# Patient Record
Sex: Male | Born: 1980 | Race: Black or African American | Hispanic: No | Marital: Married | Smoking: Current every day smoker
Health system: Southern US, Community
[De-identification: ages and names within clinical notes are randomized; demographics above are authoritative.]

## PROBLEM LIST (undated history)

## (undated) HISTORY — PX: CHOLECYSTECTOMY: SHX55

---

## 2001-11-04 ENCOUNTER — Emergency Department (HOSPITAL_COMMUNITY): Admission: EM | Admit: 2001-11-04 | Discharge: 2001-11-04 | Payer: Self-pay | Admitting: Emergency Medicine

## 2001-11-04 ENCOUNTER — Encounter: Payer: Self-pay | Admitting: Emergency Medicine

## 2002-07-13 ENCOUNTER — Encounter: Payer: Self-pay | Admitting: *Deleted

## 2002-07-13 ENCOUNTER — Emergency Department (HOSPITAL_COMMUNITY): Admission: EM | Admit: 2002-07-13 | Discharge: 2002-07-13 | Payer: Self-pay | Admitting: *Deleted

## 2002-08-01 ENCOUNTER — Emergency Department (HOSPITAL_COMMUNITY): Admission: EM | Admit: 2002-08-01 | Discharge: 2002-08-01 | Payer: Self-pay | Admitting: Emergency Medicine

## 2002-08-01 ENCOUNTER — Encounter: Payer: Self-pay | Admitting: Emergency Medicine

## 2008-10-09 ENCOUNTER — Inpatient Hospital Stay (HOSPITAL_COMMUNITY): Admission: EM | Admit: 2008-10-09 | Discharge: 2008-10-14 | Payer: Self-pay | Admitting: Emergency Medicine

## 2008-10-09 ENCOUNTER — Ambulatory Visit: Payer: Self-pay | Admitting: Internal Medicine

## 2008-10-20 ENCOUNTER — Inpatient Hospital Stay (HOSPITAL_COMMUNITY): Admission: EM | Admit: 2008-10-20 | Discharge: 2008-11-03 | Payer: Self-pay | Admitting: Emergency Medicine

## 2008-10-20 ENCOUNTER — Ambulatory Visit: Payer: Self-pay | Admitting: Internal Medicine

## 2008-10-22 ENCOUNTER — Ambulatory Visit: Payer: Self-pay | Admitting: Gastroenterology

## 2008-11-14 ENCOUNTER — Ambulatory Visit: Payer: Self-pay | Admitting: Internal Medicine

## 2008-11-14 ENCOUNTER — Ambulatory Visit (HOSPITAL_COMMUNITY): Admission: RE | Admit: 2008-11-14 | Discharge: 2008-11-14 | Payer: Self-pay | Admitting: Internal Medicine

## 2008-11-14 ENCOUNTER — Encounter (INDEPENDENT_AMBULATORY_CARE_PROVIDER_SITE_OTHER): Payer: Self-pay | Admitting: *Deleted

## 2008-11-14 DIAGNOSIS — K869 Disease of pancreas, unspecified: Secondary | ICD-10-CM | POA: Insufficient documentation

## 2008-11-14 DIAGNOSIS — R000 Tachycardia, unspecified: Secondary | ICD-10-CM

## 2008-11-17 ENCOUNTER — Emergency Department (HOSPITAL_COMMUNITY): Admission: EM | Admit: 2008-11-17 | Discharge: 2008-11-17 | Payer: Self-pay | Admitting: Emergency Medicine

## 2008-11-20 DIAGNOSIS — D709 Neutropenia, unspecified: Secondary | ICD-10-CM

## 2008-11-24 ENCOUNTER — Inpatient Hospital Stay (HOSPITAL_COMMUNITY): Admission: EM | Admit: 2008-11-24 | Discharge: 2008-12-04 | Payer: Self-pay | Admitting: Emergency Medicine

## 2008-11-24 ENCOUNTER — Ambulatory Visit: Payer: Self-pay | Admitting: Pulmonary Disease

## 2008-11-30 ENCOUNTER — Ambulatory Visit: Payer: Self-pay | Admitting: Internal Medicine

## 2008-12-13 ENCOUNTER — Inpatient Hospital Stay (HOSPITAL_COMMUNITY): Admission: EM | Admit: 2008-12-13 | Discharge: 2008-12-21 | Payer: Self-pay | Admitting: *Deleted

## 2008-12-16 ENCOUNTER — Encounter (INDEPENDENT_AMBULATORY_CARE_PROVIDER_SITE_OTHER): Payer: Self-pay | Admitting: General Surgery

## 2009-05-01 IMAGING — CR DG ABDOMEN ACUTE W/ 1V CHEST
4 series · 4 of 4 positions shown · non-contrast
Comparison: CT 10/30/2008

CLINICAL DATA: Abdominal pain.  Vomiting blood.  History
pancreatitis.

ACUTE ABDOMEN SERIES (ABDOMEN 2 VIEW & CHEST 1 VIEW)

[w chest pa]
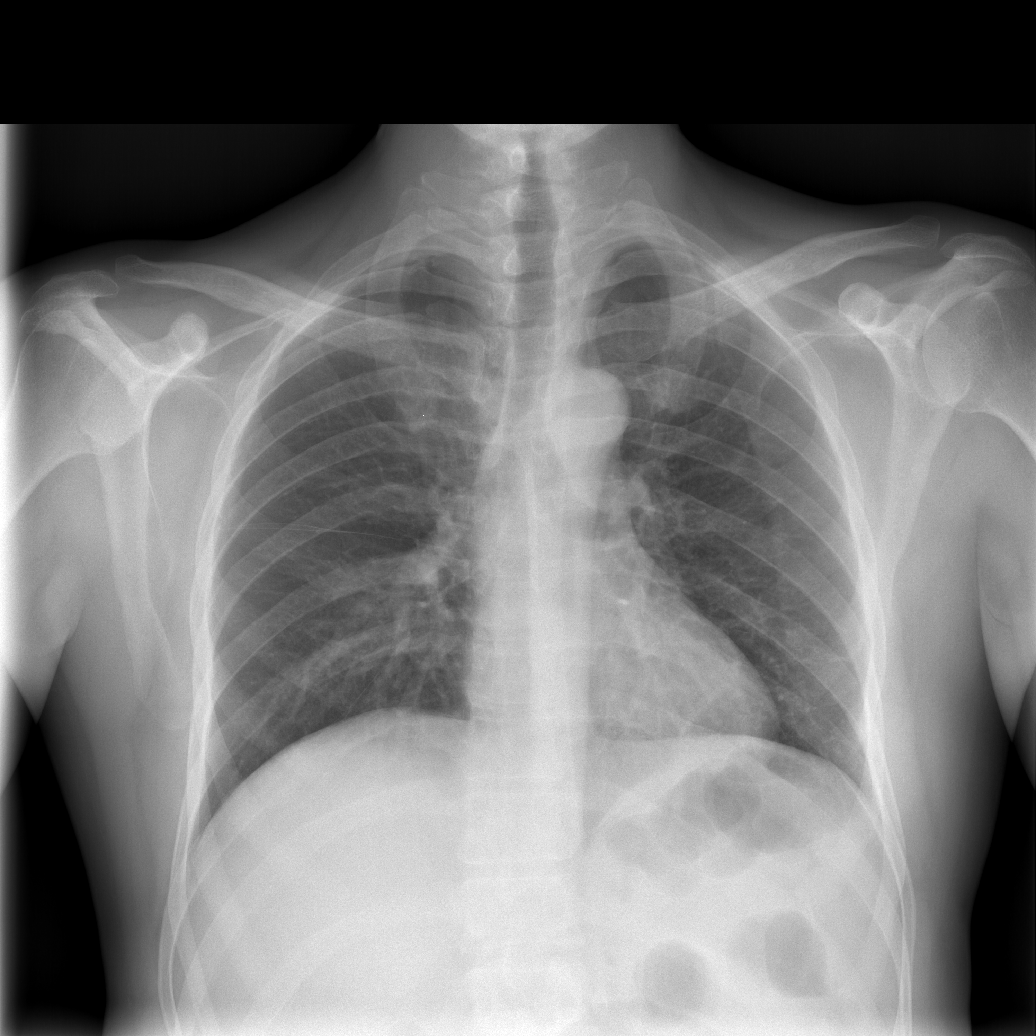

[w abdomen upright *]
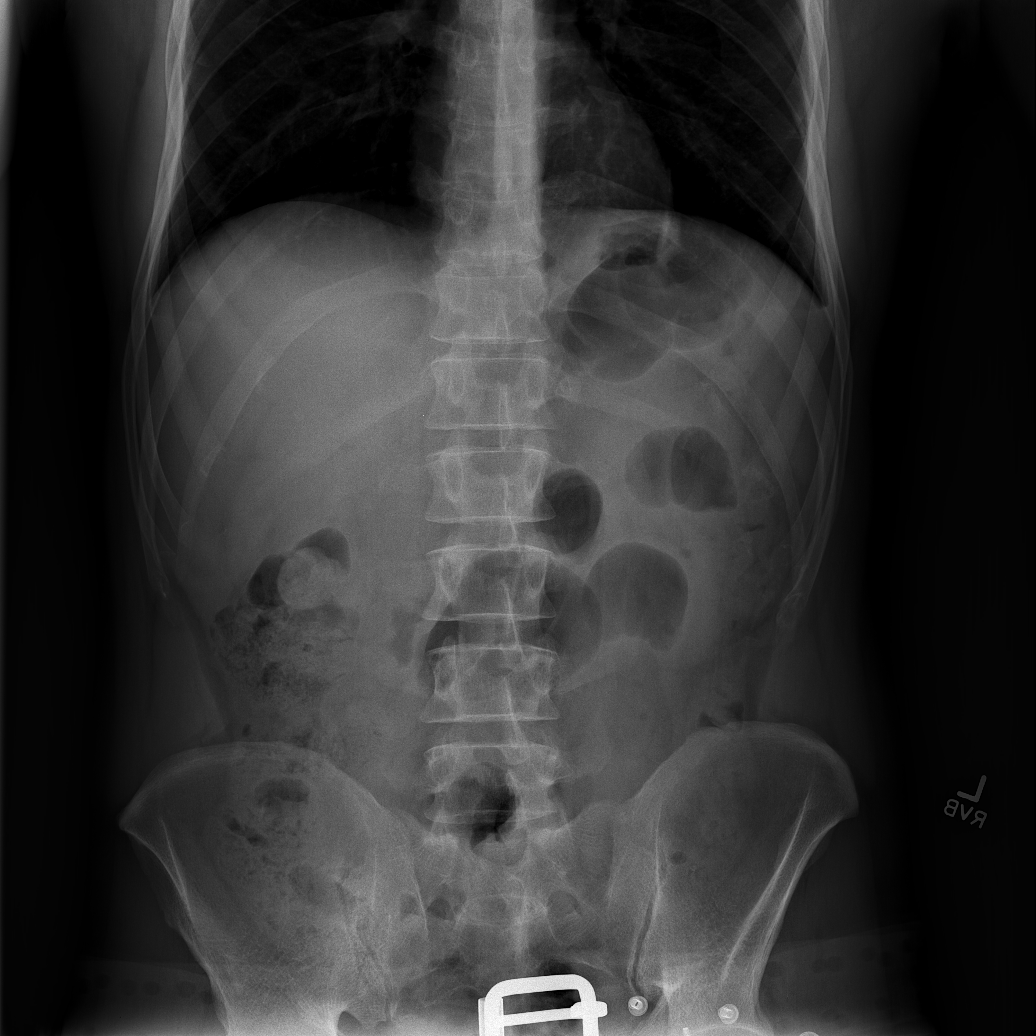

[t abdomen supine (1 of 2)]
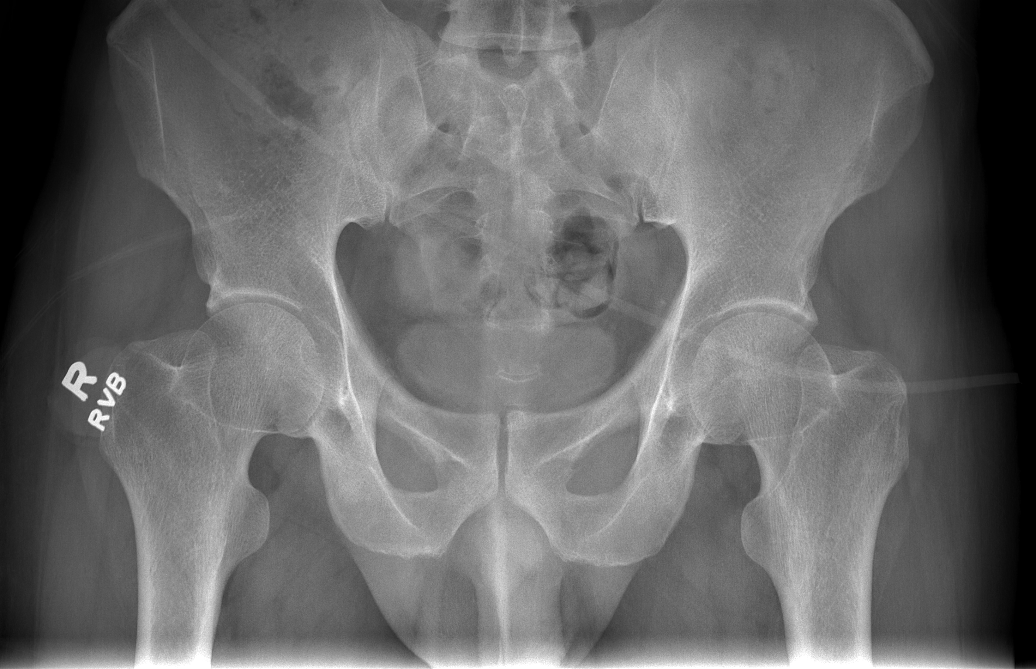

[t abdomen supine (2 of 2)]
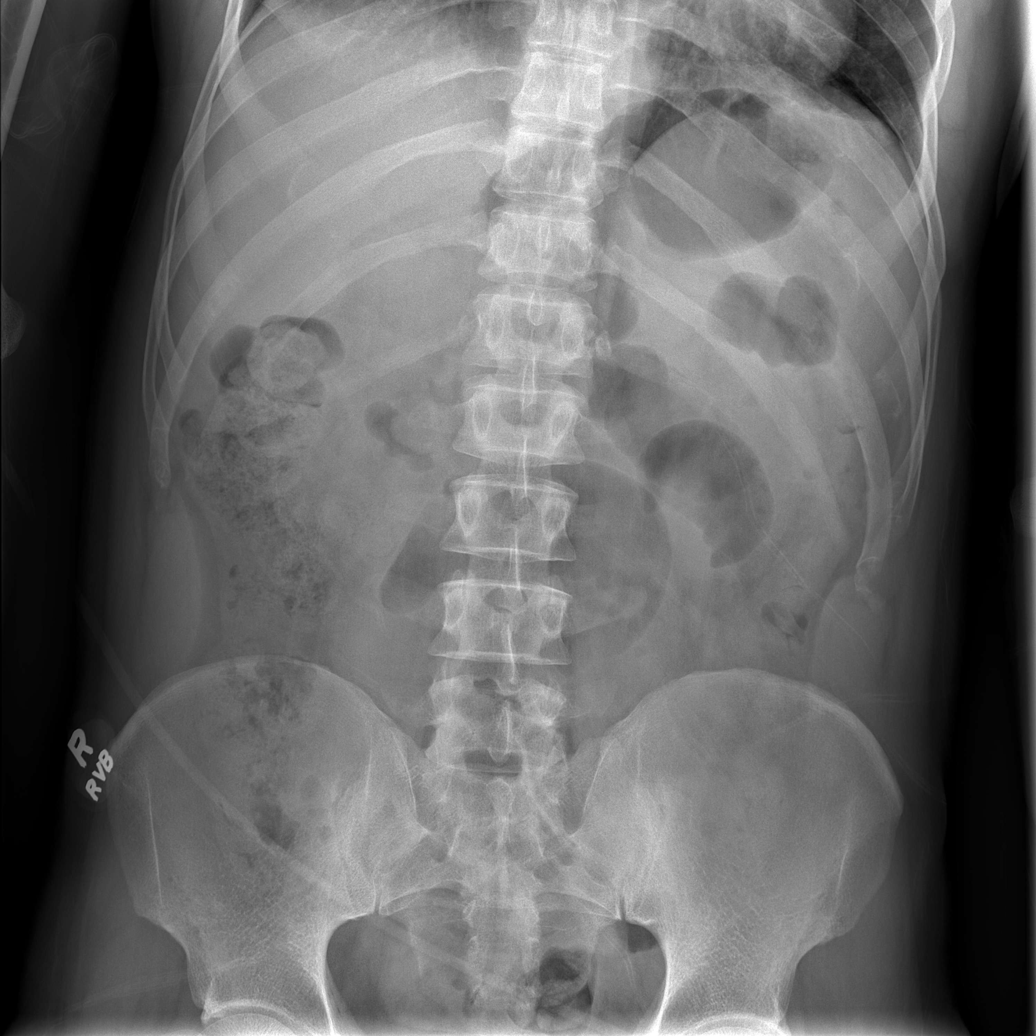

[4 of 4 positions shown; findings below may reference images not displayed]

FINDINGS: No active chest disease.  Gas distended loops of small
bowel in the mid to upper abdomen.  Findings are compatible with a
nonspecific ileus.  No free air.  Properitoneal fat stripes are
defined.
IMPRESSION: Nonspecific small bowel ileus.

## 2009-05-01 IMAGING — US US ABDOMEN PORT
1 series · 14 of 25 positions shown · non-contrast
Comparison: CT scan of the abdomen dated 11/24/2008

CLINICAL DATA: Acute pancreatitis.

ABDOMEN ULTRASOUND
TECHNIQUE: Complete abdominal ultrasound examination was performed
including evaluation of the liver, gallbladder, bile ducts,
pancreas, kidneys, spleen, IVC, and abdominal aorta.

[Series 1: unknown · 0.28mm/px · 14 of 79 slices shown]
[im 1/79]
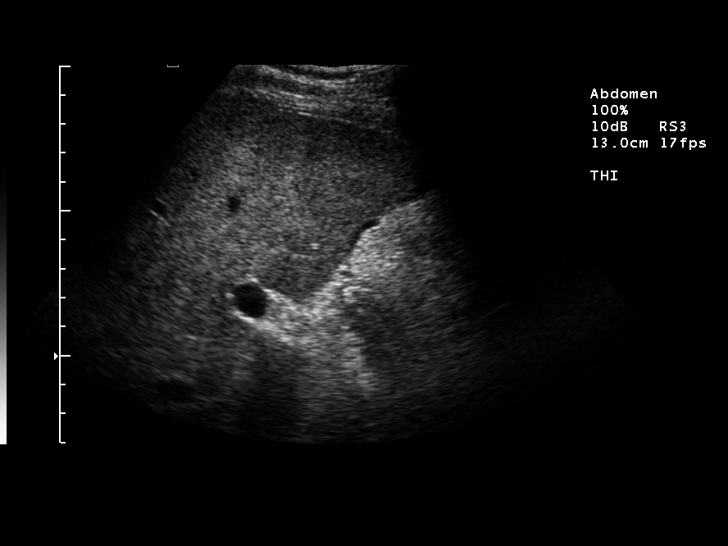
[im 7/79]
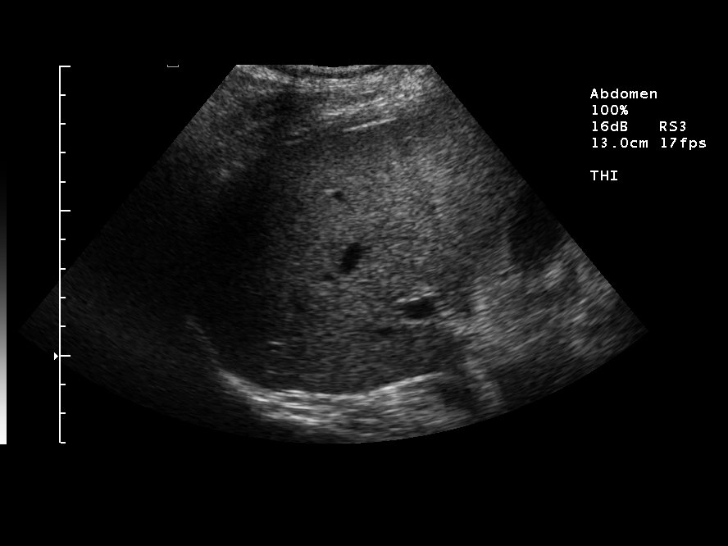
[im 14/79]
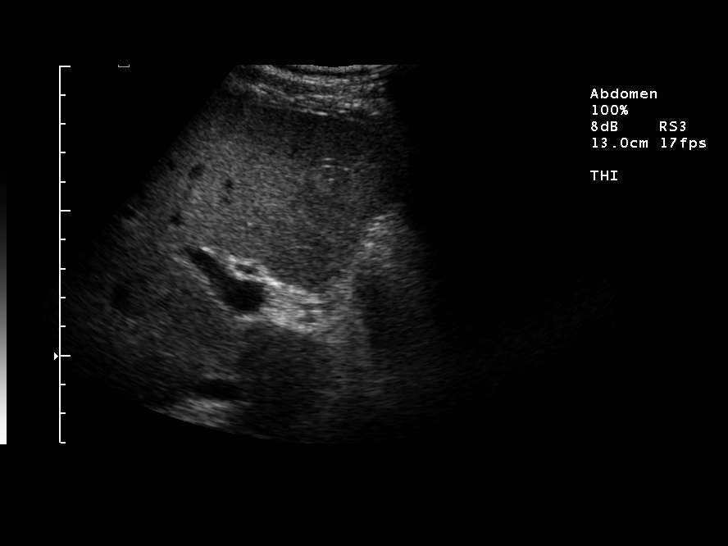
[im 20/79]
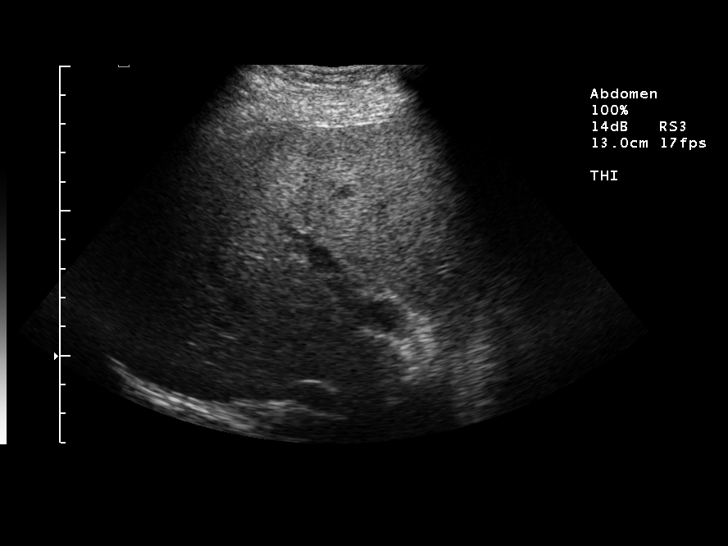
[im 27/79]
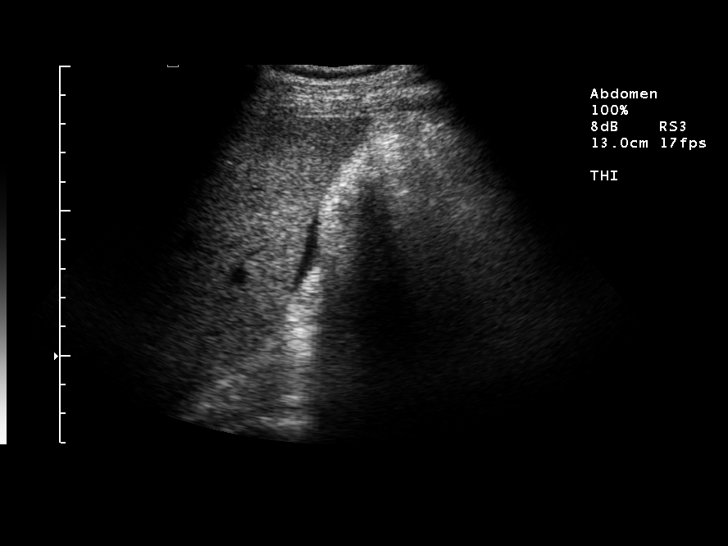
[im 30/79]
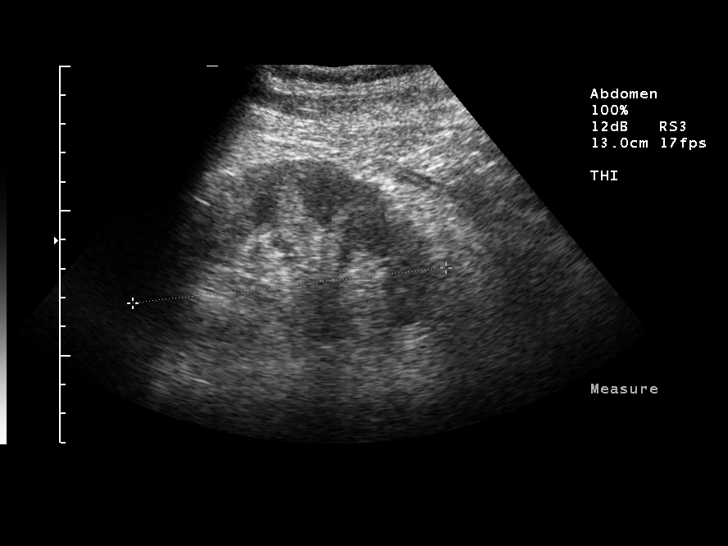
[im 36/79]
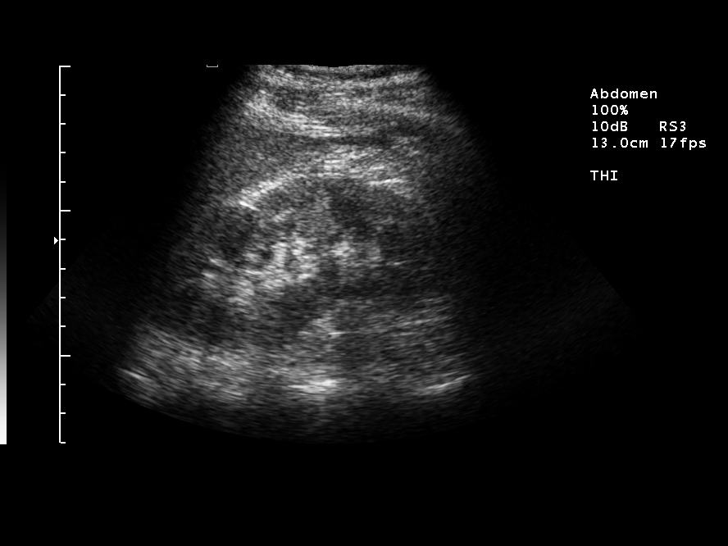
[im 43/79]
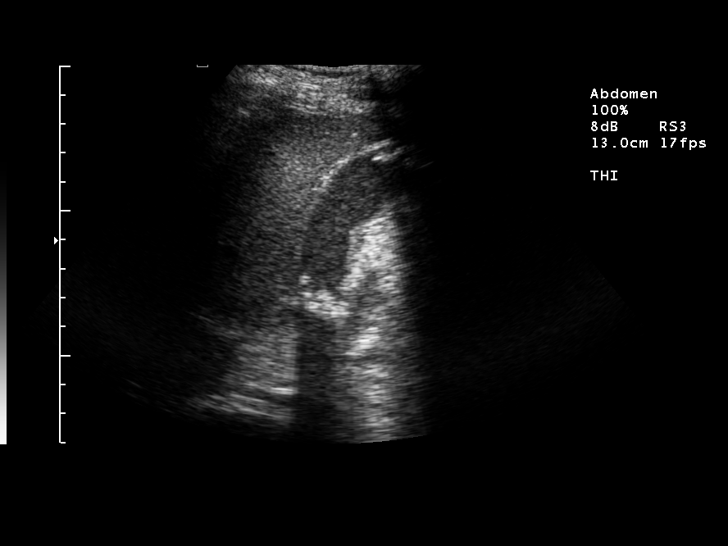
[im 49/79]
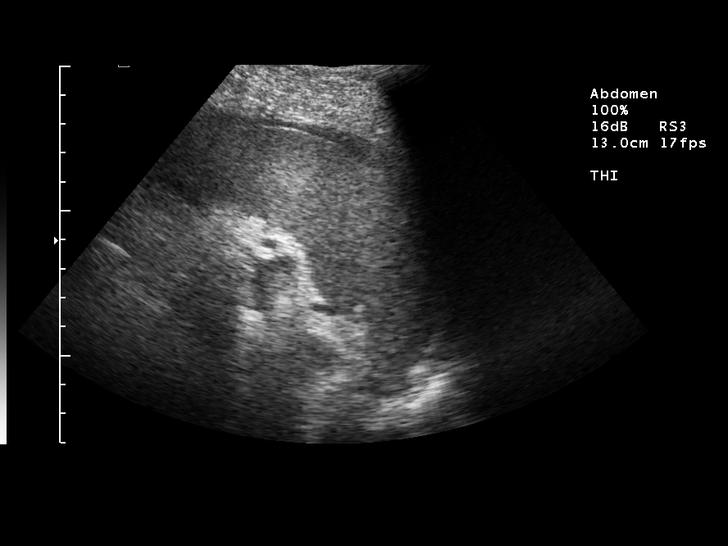
[im 53/79]
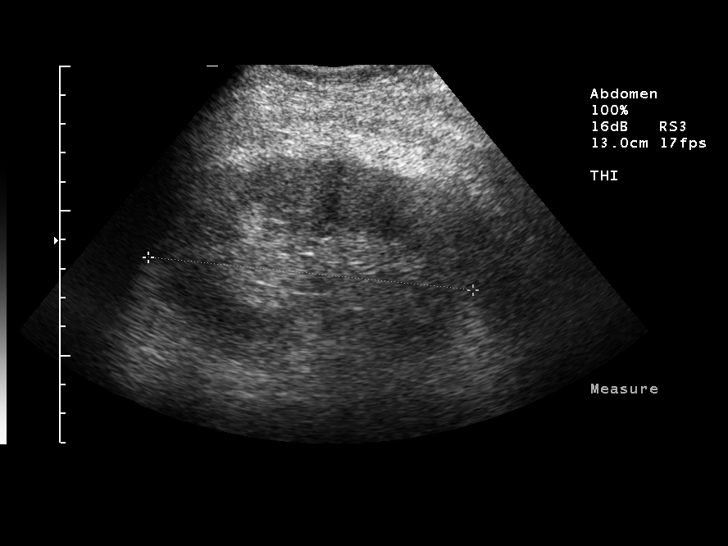
[im 59/79]
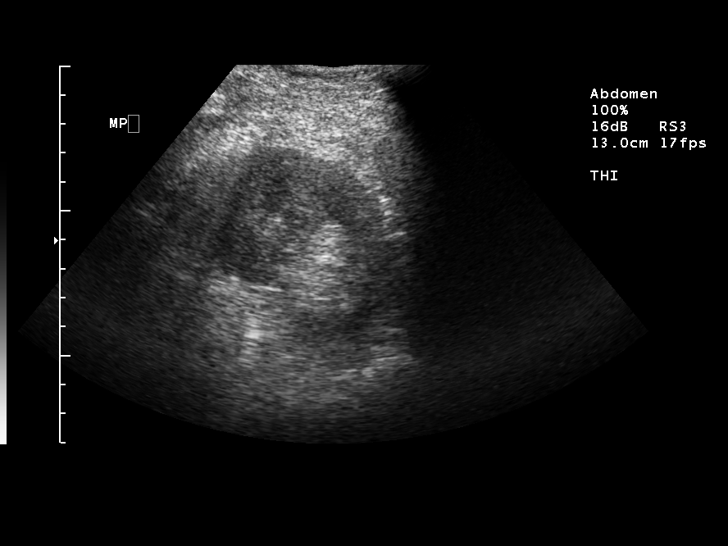
[im 66/79]
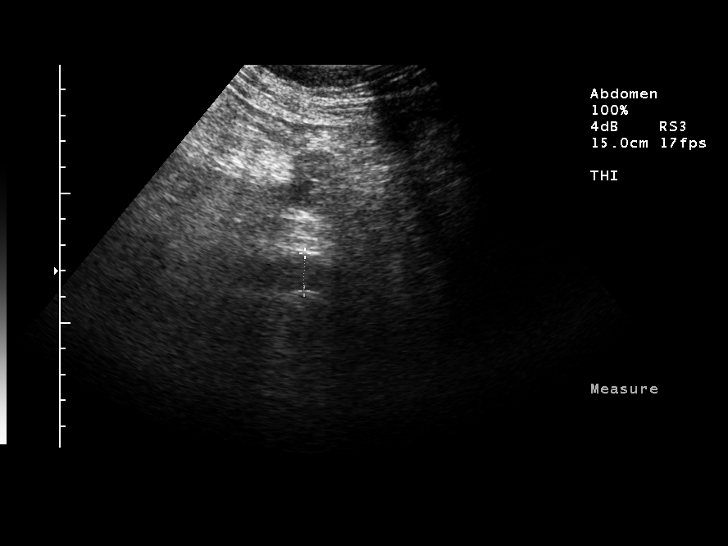
[im 72/79]
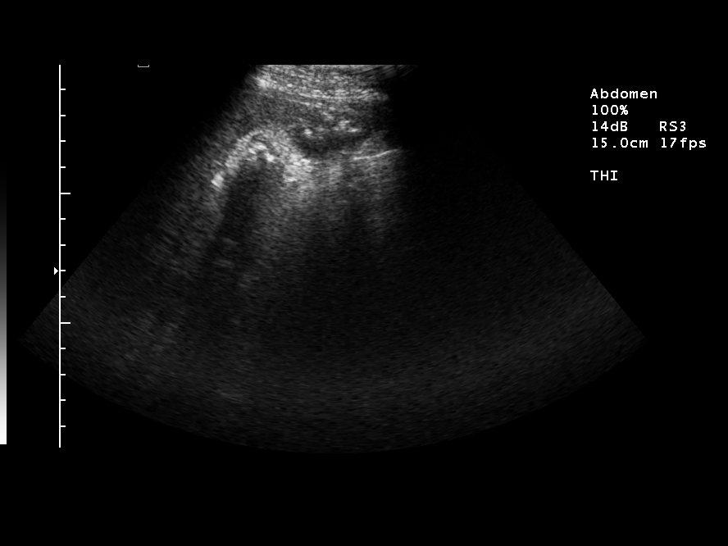
[im 79/79]
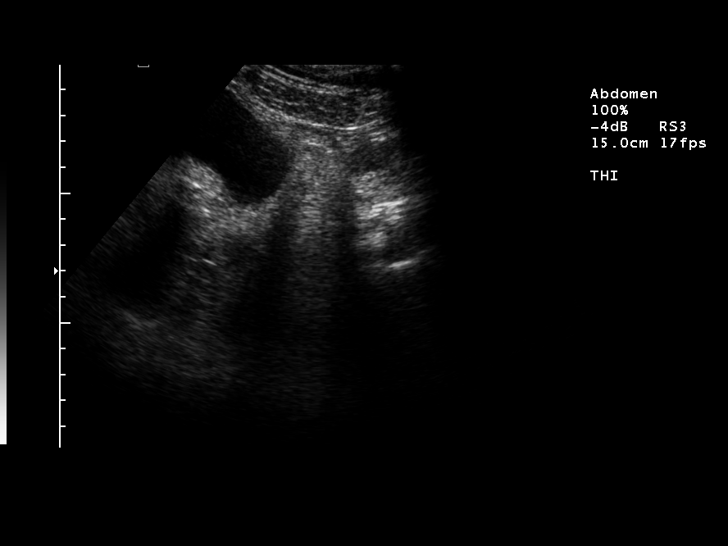

[14 of 25 positions shown; findings below may reference images not displayed]

FINDINGS: There are multiple stones in the gallbladder.  There is a
negative sonographic Murphy's sign.  Common bile duct is normal
with a maximal diameter of  4 mm.

The liver parenchyma, inferior vena cava, spleen, kidneys, and
abdominal aorta appear normal.

The spleen is 9 cm in length.  The right kidney is 10.9 cm in
length and the left kidney is 11.3 cm in length.  Maximal diameter
of the abdominal aorta is 2.1 cm.

The pancreas is obscured by overlying bowel gas.  There is ascites
visible in the abdomen.
IMPRESSION: Multiple gallstones.  No dilated bile ducts.  Ascites secondary to
the patient's known pancreatitis.

## 2010-12-28 LAB — CONVERTED CEMR LAB
Basophils Absolute: 0 10*3/uL (ref 0.0–0.1)
Basophils Relative: 1 % (ref 0–1)
Cocaine Metabolites: NEGATIVE
Creatinine,U: 457.4 mg/dL
Eosinophils Absolute: 0.1 10*3/uL (ref 0.0–0.7)
MCHC: 32.9 g/dL (ref 30.0–36.0)
MCV: 89.4 fL (ref 78.0–100.0)
Monocytes Relative: 12 % (ref 3–12)
Neutro Abs: 1 10*3/uL — ABNORMAL LOW (ref 1.7–7.7)
Neutrophils Relative %: 32 % — ABNORMAL LOW (ref 43–77)
Opiates: NEGATIVE
Phencyclidine (PCP): NEGATIVE
Platelets: 330 10*3/uL (ref 150–400)
Propoxyphene: NEGATIVE
RBC: 4.42 M/uL (ref 4.22–5.81)
RDW: 13.7 % (ref 11.5–15.5)

## 2011-02-21 ENCOUNTER — Emergency Department (HOSPITAL_COMMUNITY)
Admission: EM | Admit: 2011-02-21 | Discharge: 2011-02-21 | Disposition: A | Discharge status: Home / Self Care | Payer: Medicaid Other | Attending: Emergency Medicine | Admitting: Emergency Medicine

## 2011-02-21 DIAGNOSIS — R109 Unspecified abdominal pain: Secondary | ICD-10-CM | POA: Insufficient documentation

## 2011-02-21 DIAGNOSIS — I10 Essential (primary) hypertension: Secondary | ICD-10-CM | POA: Insufficient documentation

## 2011-02-21 DIAGNOSIS — R300 Dysuria: Secondary | ICD-10-CM | POA: Insufficient documentation

## 2011-02-21 LAB — URINALYSIS, ROUTINE W REFLEX MICROSCOPIC
Bilirubin Urine: NEGATIVE
Ketones, ur: NEGATIVE mg/dL
Nitrite: NEGATIVE
Specific Gravity, Urine: 1.03 (ref 1.005–1.030)
Urobilinogen, UA: 1 mg/dL (ref 0.0–1.0)
pH: 5.5 (ref 5.0–8.0)

## 2011-02-21 LAB — CBC
MCHC: 35.8 g/dL (ref 30.0–36.0)
RDW: 12.6 % (ref 11.5–15.5)
WBC: 3.7 10*3/uL — ABNORMAL LOW (ref 4.0–10.5)

## 2011-02-21 LAB — COMPREHENSIVE METABOLIC PANEL
ALT: 66 U/L — ABNORMAL HIGH (ref 0–53)
AST: 33 U/L (ref 0–37)
Albumin: 3.9 g/dL (ref 3.5–5.2)
Calcium: 9.3 mg/dL (ref 8.4–10.5)
GFR calc Af Amer: >60 mL/min (ref >60)
Potassium: 4.3 mEq/L (ref 3.5–5.1)
Sodium: 138 mEq/L (ref 135–145)
Total Protein: 7.1 g/dL (ref 6.0–8.3)

## 2011-02-21 LAB — DIFFERENTIAL
Basophils Absolute: 0 10*3/uL (ref 0.0–0.1)
Basophils Relative: 0 % (ref 0–1)
Eosinophils Relative: 1 % (ref 0–5)
Monocytes Absolute: 0.5 10*3/uL (ref 0.1–1.0)
Neutro Abs: 1.4 10*3/uL — ABNORMAL LOW (ref 1.7–7.7)

## 2011-03-16 LAB — CBC
HCT: 33.8 % — ABNORMAL LOW (ref 39.0–52.0)
HCT: 37.9 % — ABNORMAL LOW (ref 39.0–52.0)
HCT: 43.6 % (ref 39.0–52.0)
HCT: 33.9 % — ABNORMAL LOW (ref 39.0–52.0)
HCT: 34.2 % — ABNORMAL LOW (ref 39.0–52.0)
HCT: 34.5 % — ABNORMAL LOW (ref 39.0–52.0)
Hemoglobin: 11.7 g/dL — ABNORMAL LOW (ref 13.0–17.0)
Hemoglobin: 11.8 g/dL — ABNORMAL LOW (ref 13.0–17.0)
Hemoglobin: 12.1 g/dL — ABNORMAL LOW (ref 13.0–17.0)
Hemoglobin: 14.6 g/dL (ref 13.0–17.0)
Hemoglobin: 11.8 g/dL — ABNORMAL LOW (ref 13.0–17.0)
Hemoglobin: 11.8 g/dL — ABNORMAL LOW (ref 13.0–17.0)
Hemoglobin: 12 g/dL — ABNORMAL LOW (ref 13.0–17.0)
Hemoglobin: 12 g/dL — ABNORMAL LOW (ref 13.0–17.0)
MCHC: 33.5 g/dL (ref 30.0–36.0)
MCHC: 34.6 g/dL (ref 30.0–36.0)
MCHC: 34.1 g/dL (ref 30.0–36.0)
MCHC: 34.2 g/dL (ref 30.0–36.0)
MCHC: 34.8 g/dL (ref 30.0–36.0)
MCV: 88.2 fL (ref 78.0–100.0)
MCV: 89.3 fL (ref 78.0–100.0)
MCV: 87.7 fL (ref 78.0–100.0)
MCV: 87.9 fL (ref 78.0–100.0)
Platelets: 306 10*3/uL (ref 150–400)
Platelets: 379 10*3/uL (ref 150–400)
Platelets: 419 10*3/uL — ABNORMAL HIGH (ref 150–400)
Platelets: 230 10*3/uL (ref 150–400)
Platelets: 233 10*3/uL (ref 150–400)
Platelets: 246 10*3/uL (ref 150–400)
RBC: 3.83 MIL/uL — ABNORMAL LOW (ref 4.22–5.81)
RBC: 4.24 MIL/uL (ref 4.22–5.81)
RBC: 3.87 MIL/uL — ABNORMAL LOW (ref 4.22–5.81)
RBC: 3.9 MIL/uL — ABNORMAL LOW (ref 4.22–5.81)
RDW: 14.5 % (ref 11.5–15.5)
RDW: 14.5 % (ref 11.5–15.5)
RDW: 14.7 % (ref 11.5–15.5)
RDW: 14.7 % (ref 11.5–15.5)
RDW: 14.6 % (ref 11.5–15.5)
RDW: 14.6 % (ref 11.5–15.5)
RDW: 14.6 % (ref 11.5–15.5)
RDW: 14.7 % (ref 11.5–15.5)
RDW: 14.9 % (ref 11.5–15.5)
WBC: 5.5 10*3/uL (ref 4.0–10.5)
WBC: 2.4 10*3/uL — ABNORMAL LOW (ref 4.0–10.5)
WBC: 2.5 10*3/uL — ABNORMAL LOW (ref 4.0–10.5)

## 2011-03-16 LAB — COMPREHENSIVE METABOLIC PANEL
ALT: 163 U/L — ABNORMAL HIGH (ref 0–53)
ALT: 37 U/L (ref 0–53)
ALT: 73 U/L — ABNORMAL HIGH (ref 0–53)
ALT: 41 U/L (ref 0–53)
ALT: 43 U/L (ref 0–53)
ALT: 54 U/L — ABNORMAL HIGH (ref 0–53)
ALT: 63 U/L — ABNORMAL HIGH (ref 0–53)
ALT: 65 U/L — ABNORMAL HIGH (ref 0–53)
AST: 63 U/L — ABNORMAL HIGH (ref 0–37)
AST: 96 U/L — ABNORMAL HIGH (ref 0–37)
AST: 24 U/L (ref 0–37)
AST: 25 U/L (ref 0–37)
Albumin: 2.9 g/dL — ABNORMAL LOW (ref 3.5–5.2)
Albumin: 3.2 g/dL — ABNORMAL LOW (ref 3.5–5.2)
Albumin: 3.7 g/dL (ref 3.5–5.2)
Albumin: 3.3 g/dL — ABNORMAL LOW (ref 3.5–5.2)
Albumin: 3.4 g/dL — ABNORMAL LOW (ref 3.5–5.2)
Albumin: 3.4 g/dL — ABNORMAL LOW (ref 3.5–5.2)
Albumin: 3.5 g/dL (ref 3.5–5.2)
Alkaline Phosphatase: 107 U/L (ref 39–117)
Alkaline Phosphatase: 133 U/L — ABNORMAL HIGH (ref 39–117)
Alkaline Phosphatase: 174 U/L — ABNORMAL HIGH (ref 39–117)
Alkaline Phosphatase: 95 U/L (ref 39–117)
Alkaline Phosphatase: 101 U/L (ref 39–117)
Alkaline Phosphatase: 108 U/L (ref 39–117)
Alkaline Phosphatase: 116 U/L (ref 39–117)
Alkaline Phosphatase: 95 U/L (ref 39–117)
Alkaline Phosphatase: 96 U/L (ref 39–117)
BUN: 2 mg/dL — ABNORMAL LOW (ref 6–23)
BUN: 2 mg/dL — ABNORMAL LOW (ref 6–23)
BUN: 1 mg/dL — ABNORMAL LOW (ref 6–23)
BUN: 1 mg/dL — ABNORMAL LOW (ref 6–23)
BUN: <1 mg/dL — ABNORMAL LOW (ref 6–23)
BUN: <1 mg/dL — ABNORMAL LOW (ref 6–23)
CO2: 25 mEq/L (ref 19–32)
CO2: 26 mEq/L (ref 19–32)
CO2: 27 mEq/L (ref 19–32)
CO2: 27 mEq/L (ref 19–32)
CO2: 28 mEq/L (ref 19–32)
CO2: 29 mEq/L (ref 19–32)
Calcium: 9 mg/dL (ref 8.4–10.5)
Calcium: 9 mg/dL (ref 8.4–10.5)
Calcium: 9.2 mg/dL (ref 8.4–10.5)
Calcium: 9.3 mg/dL (ref 8.4–10.5)
Calcium: 9.3 mg/dL (ref 8.4–10.5)
Chloride: 104 mEq/L (ref 96–112)
Chloride: 105 mEq/L (ref 96–112)
Chloride: 97 mEq/L (ref 96–112)
Chloride: 102 mEq/L (ref 96–112)
Chloride: 103 mEq/L (ref 96–112)
Creatinine, Ser: 0.55 mg/dL (ref 0.4–1.5)
Creatinine, Ser: 0.58 mg/dL (ref 0.4–1.5)
Creatinine, Ser: 0.59 mg/dL (ref 0.4–1.5)
GFR calc Af Amer: >60 mL/min (ref >60)
GFR calc Af Amer: >60 mL/min (ref >60)
GFR calc Af Amer: >60 mL/min (ref >60)
GFR calc Af Amer: >60 mL/min (ref >60)
GFR calc Af Amer: >60 mL/min (ref >60)
GFR calc Af Amer: >60 mL/min (ref >60)
GFR calc non Af Amer: >60 mL/min (ref >60)
GFR calc non Af Amer: >60 mL/min (ref >60)
GFR calc non Af Amer: >60 mL/min (ref >60)
GFR calc non Af Amer: >60 mL/min (ref >60)
GFR calc non Af Amer: >60 mL/min (ref >60)
GFR calc non Af Amer: >60 mL/min (ref >60)
GFR calc non Af Amer: >60 mL/min (ref >60)
Glucose, Bld: 101 mg/dL — ABNORMAL HIGH (ref 70–99)
Glucose, Bld: 188 mg/dL — ABNORMAL HIGH (ref 70–99)
Glucose, Bld: 88 mg/dL (ref 70–99)
Glucose, Bld: 100 mg/dL — ABNORMAL HIGH (ref 70–99)
Glucose, Bld: 111 mg/dL — ABNORMAL HIGH (ref 70–99)
Glucose, Bld: 94 mg/dL (ref 70–99)
Glucose, Bld: 98 mg/dL (ref 70–99)
Potassium: 2.9 mEq/L — ABNORMAL LOW (ref 3.5–5.1)
Potassium: 3.9 mEq/L (ref 3.5–5.1)
Potassium: 4.1 mEq/L (ref 3.5–5.1)
Potassium: 4.6 mEq/L (ref 3.5–5.1)
Potassium: 3.5 mEq/L (ref 3.5–5.1)
Potassium: 3.6 mEq/L (ref 3.5–5.1)
Potassium: 3.6 mEq/L (ref 3.5–5.1)
Potassium: 3.8 mEq/L (ref 3.5–5.1)
Potassium: 3.8 mEq/L (ref 3.5–5.1)
Sodium: 138 mEq/L (ref 135–145)
Sodium: 140 mEq/L (ref 135–145)
Sodium: 137 mEq/L (ref 135–145)
Sodium: 138 mEq/L (ref 135–145)
Sodium: 138 mEq/L (ref 135–145)
Sodium: 140 mEq/L (ref 135–145)
Total Bilirubin: 0.8 mg/dL (ref 0.3–1.2)
Total Bilirubin: 1.3 mg/dL — ABNORMAL HIGH (ref 0.3–1.2)
Total Bilirubin: 1.3 mg/dL — ABNORMAL HIGH (ref 0.3–1.2)
Total Bilirubin: 1.5 mg/dL — ABNORMAL HIGH (ref 0.3–1.2)
Total Protein: 5.6 g/dL — ABNORMAL LOW (ref 6.0–8.3)
Total Protein: 6.2 g/dL (ref 6.0–8.3)
Total Protein: 5.9 g/dL — ABNORMAL LOW (ref 6.0–8.3)
Total Protein: 6 g/dL (ref 6.0–8.3)
Total Protein: 6 g/dL (ref 6.0–8.3)
Total Protein: 6 g/dL (ref 6.0–8.3)
Total Protein: 6.1 g/dL (ref 6.0–8.3)

## 2011-03-16 LAB — BASIC METABOLIC PANEL
Calcium: 9.3 mg/dL (ref 8.4–10.5)
GFR calc Af Amer: >60 mL/min (ref >60)
GFR calc non Af Amer: >60 mL/min (ref >60)
Glucose, Bld: 103 mg/dL — ABNORMAL HIGH (ref 70–99)
Sodium: 139 mEq/L (ref 135–145)

## 2011-03-16 LAB — URINALYSIS, ROUTINE W REFLEX MICROSCOPIC
Ketones, ur: NEGATIVE mg/dL
Nitrite: NEGATIVE
Protein, ur: 30 mg/dL — AB

## 2011-03-16 LAB — PHOSPHORUS
Phosphorus: 3.8 mg/dL (ref 2.3–4.6)
Phosphorus: 4.4 mg/dL (ref 2.3–4.6)
Phosphorus: 4.5 mg/dL (ref 2.3–4.6)
Phosphorus: 4.6 mg/dL (ref 2.3–4.6)
Phosphorus: 5 mg/dL — ABNORMAL HIGH (ref 2.3–4.6)

## 2011-03-16 LAB — MAGNESIUM
Magnesium: 1.9 mg/dL (ref 1.5–2.5)
Magnesium: 1.9 mg/dL (ref 1.5–2.5)
Magnesium: 1.9 mg/dL (ref 1.5–2.5)
Magnesium: 1.9 mg/dL (ref 1.5–2.5)
Magnesium: 2 mg/dL (ref 1.5–2.5)
Magnesium: 2.1 mg/dL (ref 1.5–2.5)

## 2011-03-16 LAB — RAPID URINE DRUG SCREEN, HOSP PERFORMED
Benzodiazepines: NOT DETECTED
Cocaine: NOT DETECTED
Tetrahydrocannabinol: NOT DETECTED

## 2011-03-16 LAB — LIPASE, BLOOD
Lipase: 186 U/L — ABNORMAL HIGH (ref 11–59)
Lipase: >2000 U/L — ABNORMAL HIGH (ref 11–59)

## 2011-03-16 LAB — GLUCOSE, CAPILLARY
Glucose-Capillary: 113 mg/dL — ABNORMAL HIGH (ref 70–99)
Glucose-Capillary: 92 mg/dL (ref 70–99)

## 2011-03-16 LAB — URINE MICROSCOPIC-ADD ON

## 2011-03-16 LAB — DIFFERENTIAL
Basophils Relative: 0 % (ref 0–1)
Eosinophils Absolute: 0.1 10*3/uL (ref 0.0–0.7)
Eosinophils Relative: 1 % (ref 0–5)
Neutrophils Relative %: 25 % — ABNORMAL LOW (ref 43–77)

## 2011-03-16 LAB — ETHANOL: Alcohol, Ethyl (B): <5 mg/dL (ref 0–10)

## 2011-04-14 NOTE — Discharge Summary (Signed)
[name], [name] NO.:  [id number]   MEDICAL RECORD NO.:  [id number]          PATIENT TYPE:  INP   LOCATION:  5001                         FACILITY:  [facility]   PHYSICIAN:  Alvester Morin, M.D.  DATE OF BIRTH:  03-17-1981   DATE OF ADMISSION:  10/09/2008  DATE OF DISCHARGE:  10/14/2008                               DISCHARGE SUMMARY   DISCHARGE DIAGNOSES:  1. Pancreatitis.  2. Alcohol abuse.  3. Cholelithiasis.  4. Undescended testicle.  5. Tobacco abuse.   DISCHARGE MEDICATIONS:  1. Ambien 5 mg, take 1 tablet by mouth each night as needed for sleep.  2. Zofran 4 mg, take 1 pill by mouth every 6 hours as needed for      nausea.  3. Ultram 50 mg, take 1 pill by mouth every 6 hours as needed for      pain.   DISPOSITION AND FOLLOWUP:  The patient was clinically improved at  discharge and was stable to be discharged home.  The patient is to  follow up in the Outpatient Internal Medicine Clinic with any available  MD.  Outpatient Internal Medicine Clinic should contact the patient to  schedule a followup appointment.  The patient can be reached at, phone  number [phone] or alternate number [phone].  The patient  should be contacted and have an appointment scheduled in the next 2  weeks.  At this appointment, the patient should have a BMET checked for  electrolyte status, should be referred to a surgeon for evaluation of  undescended testicle and for possible elective cholelithectomy.   PROCEDURES PERFORMED:  1. Abdominal ultrasound.  Impression:  Cholelithiasis without evidence      of acute cholecystitis.  Echogenic liver commonly represents      hepatic steatosis.  Pancreas poorly imaged.  2. CT abdomen.  Minimal ascites, left upper quadrant, nonspecific      suspicious for mild infiltrated and posterior basal segment of left      lower lobe.  3. CT pelvis.  Probable undescended testicle.  Recommended clinical      correlation.  No acute  pelvic findings.  4. Chest x-ray.  Mild bronchial wall thickening can be seen in the      setting of viral infection and bronchitis, may be chronic.  5. Abdominal x-ray.  Increased bibasilar atelectasis with small right      effusion.  Examination otherwise negative.   CONSULTATIONS:  No consultations.   ADMITTING HISTORY AND PHYSICAL:  The patient was a 30 year old [place]  male with history of tobacco and alcohol abuse who presented with 2 days  of severe epigastric pain.  The patient stated that until 2 days ago,  his health had been in his baseline other than what he described as a  transient URI some time in the month before admission.  Two days prior  to admission, he was unable to sleep due to perfused diaphoresis,  nausea, chills, and sudden severe epigastric pain.  The day of  admission, he had 2 bouts of emesis, one of which had slight  bloody  streaking.  The patient rated his pain at 8/10, described as constant,  radiating to his back.  He reported taking an unknown medication that  was for stomach pain, which did not help.  The patient said that pain  was worse with eating.  Denied any change in the pain with activity, any  radiation of the pain to neck or arms.  No palpitations.  No shortness  of breath.  The patient denied any recent trauma, sick contacts, or  recent travel outside of the country.  Bowel and bladder function  remained normal throughout the episode.  No cough, no fever reported.  The patient had a previous surgery at a hospital in [place], on his  abdomen, but was unsure of the procedure, apparently had an  appendectomy.   PAST MEDICAL HISTORY:  Significant only for undescended testicle and  appendectomy performed 2 years ago.  The patient admittedly abuses  alcohol and tobacco.   ADMITTING LABORATORY DATA:  White blood cell 4.2, hemoglobin 16.3,  hematocrit 47.8, platelets 202, ANC 3.5, MCV 93.  Sodium 137, potassium  3.4, chloride 104, bicarb 28,  BUN 7, creatinine 0.69, glucose 90, bili  1.6, alk phos 177, AST 117, ALT 274, protein 6, albumin 3.4, and calcium  ionized 1.13.  INR 1, GGT 405, and lipase 1013.  Admitting fasting  lipids total 178, triglycerides 69, HDL 43, and LDL 123.  Peripheral  smear was normal.  UDS positive for opiates.  HIV testing normal.  UA;  protein 30, bili small, ketones over 80, leuko esterase negative, rbc is  0-2, and white blood cell 0-2.  EKG, tachycardic with normal sinus  rhythm   HOSPITAL COURSE BY PROBLEMS:  1. Pancreatitis.  The patient's pancreatitis is most likely secondary      to cholelithiasis, could also possibly be secondary to alcohol      abuse.  The patient arrived with pain at 8/10, was treated with      morphine IV, was made n.p.o. and was hydrated aggressively.  Over      the hospital admission, the patient's abdominal pain lessened, his      nausea and vomiting both decreased while n.p.o.  The patient on      third day of admission was advanced to a clear liquid diet,      tolerated clear liquid diet appropriately and on day prior to      discharge was advanced to a solid food diet, which he tolerated      well.  The patient was without nausea or vomiting on day of      discharge.  Pain had completely subsided on day of discharge as      well.  2. Insomnia.  The patient reportedly was not able to sleep 3 days      prior to admission secondary to pain.  The patient had poor sleep      hygiene, was advised him poor sleep hygiene over admission.  The      patient also was suffering from insomnia, was even with pain      control.  Benadryl 25 mg was tried to treat insomnia without      efficacy.  Once the patient was made non-n.p.o., the patient was      tried on Ambien 5 mg which seemed to relieve his insomnia.  The      patient was written for Ambien p.r.n. sleep to go home on.  3. Undescended testicle.  The patient was noted on admitting physical      exam to have an undescended  testicle.  He said that he had a family      history of this condition with his father having undescended      testicle, dyeing secondary to a surgery and complications to the      surgery when his father was in his mid 29s.  The patient has had      increased risk for testicular cancer because of the undescended      testicle and should be evaluated in outpatient setting by surgery      for correction of this abnormality.  4. Alcohol abuse.  The patient's history at admission showed alcohol      abuse with reported 4-5, 24-to 40-ounce beers on the weekends.      During admission, he admitted using beer during the week as well.      The patient's liver findings on ultrasound were consistent with      history presented.  The patient was counseled on alcohol abuse and      was treated inpatient with thiamine and glucose therapy.  The      patient had no symptoms of withdrawal during this admission.  5. Tobacco abuse.  The patient was counseled on tobacco abuse.  He was      placed on nicotine patch while in the hospital.  He was counseled      on tobacco cessation and seemed amendable to both alcohol and      tobacco cessation once he leaves the hospital.   LABORATORIES AT DISCHARGE:  White blood cell 7.7, hemoglobin 14.5,  hematocrit 41.5, MCV 93, RDW 13.3, and platelets 263.  Sodium 133,  potassium 3.9, chloride 102, bicarb 25, glucose 119, BUN 3, and  creatinine 0.85.   PHYSICAL EXAMINATION AT DISCHARGE:  VITAL SIGNS:  Temperature 97.7,  pulse 106, respirations 20, blood pressure 116/81, and oxygen sats 97%  on room air.  GENERAL:  The patient was in no acute distress.  EYES:  Pupils equal, round, and reactive to light.  Extraocular  movements intact.  No icterus.  No redness.  ENT:  No erythema.  No oral lesions.  No exudate.  Mucous membranes  moist.  NECK:  Soft, supple and no masses.  No adenopathy or thyromegaly.  No  JVD.  No carotid bruit.  RESPIRATORY:  Clear to  auscultation bilaterally.  No wheezes, crackles,  or rhonchi.  CARDIOVASCULAR:  Tachycardic with 1/6 systolic ejection murmur, loudest  at the mitral position and no gallops or rubs.  GI:  Abdominal exam was completely normal.  Soft, nontender, and  nondistended.  Bowel sounds present.  No CVA tenderness.  GU:  Absent testicle and left scrotum.  Penis and urethra were normal.  No palpable masses in inguinal canal.  EXTREMITIES:  No rashes, edema, cyanosis, or clubbing.  MUSCULOSKELETAL:  The patient is able to move all extremities.  NEUROLOGIC:  The patient was oriented, responsive, and appropriate  during exam.  Cranial nerve II through XII intact bilaterally.  Sensation grossly intact in all extremities.      Rosanna Randy, MD  Electronically Signed      Alvester Morin, M.D.  Electronically Signed    CEM/MEDQ  D:  10/14/2008  T:  10/14/2008  Job:  [id number]   cc:   Out Patient Internal Medicine Clinic

## 2011-04-14 NOTE — Discharge Summary (Signed)
NAME:  ZALEN, SEQUEIRA                    ACCOUNT NO.:  [id number]   MEDICAL RECORD NO.:  [id number]          PATIENT TYPE:  INP   LOCATION:  1409                         FACILITY:  [facility]   PHYSICIAN:  Richarda Overlie, MD       DATE OF BIRTH:  12/25/1980   DATE OF ADMISSION:  11/24/2008  DATE OF DISCHARGE:  12/04/2008                               DISCHARGE SUMMARY   DISCHARGE DIAGNOSES:  1. Severe pancreatitis related to gallstones.  2. Intractable nausea and vomiting with coffee-ground emesis.  3. Ileus related to pancreatitis.  4. Hypokalemia.  5. Hypertension.  6. Cholelithiasis.   SUBJECTIVE:  1. This is a 30 year old male who presented to the ER with a chief      complaint of abdominal pain.  He has also been having intractable      nausea and vomiting and some coffee-ground emesis associated with      some fecal material noted starting about 10 hours prior to the      patient's presentation.  The patient was hospitalized twice prior      to this admission and was discharged with the diagnosis of      recurrent pancreatitis secondary to biliary stone.  The patient's      surgery was deferred because of pseudocyst formation and [facility] was      awaiting resolution of his pseudocyst prior to doing any kind of      surgery.  The patient presented to the ER on November 23, 2008,      with the above symptoms.  At that time, the patient had elevated      liver function tests, including a total bilirubin of 3.3, alkaline      phosphatase of 291, AST 171, ALT 332 and a lipase greater than      2000.  The patient also had some evidence of enhancement of the      gallbladder wall suggesting of cholangitis.  The patient was      admitted in the ICU for aggressive IV hydration.  Both GI and [facility]      service were called for evaluation as per Dr. Cira Servant.  Surgical      consultation was recommended and Dr. Dwain Sarna was contacted.      According to their assessment, pancreatitis was thought to be  secondary to biliary origin.  He will eventually need      cholecystectomy.  The patient's ileus was thought to be secondary      to pancreatitis which continued to improve, however, the patient      developed intractable hiccups which were treated with Thorazine.      The patient was treated with broad-spectrum antibiotics for about 5      days from November 24, 2008 until November 28, 2008.  Both his      antibiotics were discontinued on November 28, 2008.  The patient      remained afebrile since then.   1. Coffee-ground emesis.  The patient's coffee-ground emesis was      thought to be  secondary to esophagitis.  His hemoglobin and      hematocrit remained stable.  He does have a history of alcohol      abuse and the possibility of variceal bleeding was entertained,      however, the patient's emesis was self-limited without any changes      in his hemoglobin and hematocrit.  The patient was subsequently      transferred to the stepdown unit.  His diet was advanced which he      tolerated well.  He had a repeat CT scan done on December 03, 2008,      that showed decrease in patchy pelvic fluid collections with small      residue pocket in the anterior left pelvis.  There was a      significant decrease in peripancreatic and perigastric fluid      collections.  He was noted to have small bilateral pleural      effusions.  Dr. Cliffton Asters continued to followed the patient and      recommended follow up in his clinic in about 1-week.  2. Sinus tachycardia without any EKG changes was thought to be      secondary to pain and inflammation.  He was started on beta-blocker      which he will continue.  3. Hiccups.  The patient will continue with Thorazine for another 3      days and then stop.   DISPOSITION:  Patient to follow up with Dr. Cliffton Asters, [facility] at [phone], in about 1-week.  This appointment will be setup  prior to discharge.  Follow up with [facility] in 5-7 days.    DISCHARGE MEDICATIONS:  1. Thorazine 25 p.o. daily for 3 days.  2. Toprol XL 25 mg p.o. daily.  3. Prilosec 40 mg p.o. daily.  4. Mylanta 80 mg p.o. q.12.  5. Colace 200 mg p.o. q.12.  6. Phenergan 12.5 p.o. q.4 h., p.r.n. pain.  7. Ultram 50 mg p.o. q.6 h., p.r.n. pain.      Richarda Overlie, MD  Electronically Signed     NA/MEDQ  D:  12/04/2008  T:  12/04/2008  Job:  [id number]

## 2011-04-14 NOTE — Consult Note (Signed)
[name], [name]                    ACCOUNT NO.:  [id number]   MEDICAL RECORD NO.:  [id number]          PATIENT TYPE:  INP   LOCATION:  1307                         FACILITY:  [facility]   PHYSICIAN:  Sharlet Salina T. Hoxworth, M.D.DATE OF BIRTH:  [birth year]   DATE OF CONSULTATION:  DATE OF DISCHARGE:                                 CONSULTATION   REFERRING PHYSICIAN:  Lonia Blood, M.D.   CHIEF COMPLAINT:  Recurrent abdominal pain.   HISTORY OF PRESENT ILLNESS:  I was asked by Dr. Mikeal Hawthorne from the [facility]  team to evaluate [name].  He is a 30 year old male originally from  [place].  He has had several hospitalizations over the past year for  recurrent pancreatitis.  There was initially some concern that this was  alcohol related, but he has documented gallstones and has been  abstaining from any alcohol for number of months.  He recently was  admitted from December 26 to December 04, 2008, at [facility] with  recurrent pancreatitis.  He had had a pancreatic pseudocyst and the plan  was to allow this to resolve and then proceed with elective  cholecystectomy.  However, he had only been home a few days when he  developed recurrent severe epigastric pain and was admitted at [facility] on December 13, 2008.  Since that time the pain has  gradually resolved and on evaluation today, he is not having any  significant discomfort except some mild pain when he tries to eat.  No  fever or chills.   PAST MEDICAL HISTORY:  Significant as above.  He states he had an  apparent laparoscopy diagnostic about 2 years ago out of town, unknown  reasons.  He is treated for hypertension.   MEDICATIONS ON ADMISSION:  1. Toprol-XL 25 daily.  2. Omeprazole 40 daily.  3. Tramadol p.r.n.  4. Phenergan p.r.n.   ALLERGIES:  None.   SOCIAL HISTORY:  He states he used to drink 3 or 4 beers a day on  weekends only.  He has not had anything to drink for 2 months.  He has  used tobacco in the past but  nothing in months.   FAMILY HISTORY:  Significant for high blood pressure.   REVIEW OF SYSTEMS:  GENERAL:  No fever, chills, or weight change.  RESPIRATORY:  Denies shortness of breath, cough, history of lung  problems.  CARDIAC:  Denies chest pain, history of heart disease.  ABDOMEN:  GI as above.   PHYSICAL EXAMINATION:  VITAL SIGNS:  He is afebrile, heart rate 89,  respirations 20, blood pressure 111/81. O2 sats were 99% on room air.  GENERAL:  Thin, well-developed African-American male in no acute  distress.  SKIN:  Warm and dry.  No rash or infection.  HEENT:  No icterus.  No masses.  Oropharynx clear.  Lymph nodes  nonpalpable.  LUNGS:  Clear without wheezing or increased work of breath.  CARDIAC:  Regular rate rhythm.  No murmurs.  No edema.  ABDOMEN:  Minimal epigastric tenderness.  No distension.  No masses.  EXTREMITIES:  No joint swelling or deformity.   LABORATORY DATA AND X-RAY:  On admission on the 14th, white count was  7,000, hemoglobin 14.6.  Potassium was 2.9.  LFTs were abnormal for  bilirubin of 2.1.  Alkaline phosphatase 174, SGOT and SGPT 96 and 163  respectively.  Alcohol level was undetectable.  Drug screen was  negative.  Lipase was greater than 2,000.  Lipase today is 43.  LFTs are  improved.  Bilirubin 15, alkaline phosphatase 116, OT and PT 25 and 65  respectively.   IMAGING:  Ultrasound January 14 shows multiple gallstones, mild  gallbladder wall thickening up to 4 mm, bile duct normal at 5 mm.  CT  scan of the abdomen and pelvis from January 4 showed mild peripancreatic  edema, no pseudocyst.   ASSESSMENT AND PLAN:  Recurrent gallstone pancreatitis.  This current  episode is resolving.  He needs cholecystectomy and cholangiogram this  admission.  This was discussed with the patient who agrees strongly.  We  will plan a surgery for tomorrow morning.      Lorne Skeens. Hoxworth, M.D.  Electronically Signed     BTH/MEDQ  D:  12/15/2008  T:   12/16/2008  Job:  [id number]

## 2011-04-14 NOTE — H&P (Signed)
[name], [name] NO.:  [id number]   MEDICAL RECORD NO.:  [id number]          PATIENT TYPE:  INP   LOCATION:  1307                         FACILITY:  [facility]   PHYSICIAN:  Vania Rea, M.D. DATE OF BIRTH:  [birth year]   DATE OF ADMISSION:  12/13/2008  DATE OF DISCHARGE:                              HISTORY & PHYSICAL   PRIMARY CARE PHYSICIAN:  Unassigned.   SURGEON:  Marta Lamas. Lindie Spruce, M.D.   CHIEF COMPLAINT:  Nausea, vomiting and abdominal pain.   HISTORY OF PRESENT ILLNESS:  This is the 4th admission for this 30-year-  old [place] gentleman with symptoms and signs of pancreatitis  associated with biliary stones.  The patient has a history of alcohol  abuse and his first episode of acute pancreatitis was documented on  October 09, 2008.  Since then the patient has discontinued alcohol use  but has had recurring admissions for intractable nausea and vomiting  associated with evidence of biliary pancreatitis and also had an  occurrence of pancreatic pseudocyst which reportedly has resolved.  The  patient in the past has failed to follow up for cholecystectomy, but now  reports that he had an appointment with his surgeon on January 8.  The  patient reports that he is no longer drinking alcohol and has had no  alcohol at all for at least the past 2 months.  But, when questioned on  his diet, he does report that he has been eating a lot of ice cream,  apparently unaware that ice cream is part of a high fat diet.  The  patient reports that for the past 6 hours he has been having persistent  nausea, vomiting and abdominal pain, and that because of his recognition  of the symptoms, he came to the emergency room.   PAST MEDICAL HISTORY:  As previously documented includes:  1. Recurrent biliary pancreatitis.  2. Cholelithiasis.  3. Alcohol abuse.  4. Remote history of undescended testis.  5. History of hypertension.   MEDICATIONS:  1. Toprol XL 25 mg  daily.  2. Omeprazole 40 mg daily.  3. Tramadol 50 mg every 6 hours p.r.n.  4. Phenergan 12.5 mg every 4 hours p.r.n.   ALLERGIES:  No known drug allergies.   SOCIAL HISTORY:  History of tobacco use.  No tobacco use for the past 2  months.  History of alcohol use.  No alcohol use for the past 2 months.  Denies any drug use.  His English is limited.   FAMILY HISTORY:  Significant for hypertension in his mother, otherwise  unremarkable.   REVIEW OF SYSTEMS:  On a 10-point review of systems other than noted  above was unremarkable.   PHYSICAL EXAM:  A young African gentleman lying in the stretcher,  acutely distressed by pain, holding a basin containing bilious vomitus.  VITALS:  Temperature is 97.8, pulse 100, respiration 24, blood pressure  127/800.  He is saturating at 99% on room air.  Pupils are round and  equal.  Mucous membranes pink.  Anicteric.  He is mildly dehydrated.  No  cervical lymphadenopathy or thyromegaly.  No jugular venous distention.  CHEST:  Clear to auscultation bilaterally.  CARDIOVASCULAR SYSTEM:  Tachycardiac.  ABDOMEN:  Mildly distended.  He is exquisitely tender in the epigastrium  and over most of the abdomen.  EXTREMITIES:  Without edema.  He has 2+ dorsalis pedis pulses  bilaterally.  CENTRAL NERVOUS SYSTEM:  Cranial nerves II-XII are grossly intact and he  has no focal neurologic deficits.   LABS:  His white count is 7.1, hemoglobin 14.6, platelets 471, absolute  granulocyte count is under 1.6, he has 66% lymphocytes, and his absolute  lymphocyte count is slightly elevated at 4.6.  His laboratory studies  are otherwise unremarkable.  His serum chemistry is significant for a  potassium of 2.9 and a glucose of 188.  His AST is 96.  His ALT is 163  and alkaline phosphatase is elevated to 174.  His total bilirubin is  elevated at 2.1.  His lab studies are otherwise unremarkable.  Urinalysis:  He has a trace leukocyte esterase, total protein 30, pH is   8.0, is otherwise unremarkable.  Acute abdominal series shows a benign-  appearing abdomen and chest.  His urine drug screen is positive only for  opiates.  His alcohol level is undetectable.  His serum lipase is  greater than 2000.  Abdominal ultrasound reveals cholelithiasis, slight  gallbladder wall thickening, no sonographic Murphy sign.  He has a  slight area of hepatic ascites and findings are described as similar to  a prior study.   ASSESSMENT:  1. Acute biliary pancreatitis.  2. Severe hypokalemia.  3. Hyperglycemia.  4. History of hypertension.   PLAN:  Will admit this gentleman for hydration and pain control and  hopefully he will once again settle.  Will reinforce his understanding  of low-fat diet or possibly discharge him home with pancreatic enzymes  when he is stable or do whatever is required to keep him stable until  the time of his appointed surgery.  Other plans as per orders.      Vania Rea, M.D.  Electronically Signed     LC/MEDQ  D:  12/13/2008  T:  12/13/2008  Job:  [id number]   cc:   Cherylynn Ridges, M.D.  [address]  [place]  [place] [address]

## 2011-04-14 NOTE — Discharge Summary (Signed)
[name], [name] NO.:  [id number]   MEDICAL RECORD NO.:  [id number]          PATIENT TYPE:  INP   LOCATION:  3018                         FACILITY:  [facility]   PHYSICIAN:  Alvester Morin, M.D.  DATE OF BIRTH:  03/10/1981   DATE OF ADMISSION:  10/20/2008  DATE OF DISCHARGE:  11/03/2008                               DISCHARGE SUMMARY   DISCHARGE DIAGNOSES:  1. 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  2. Clostridium difficile colitis.  3. Tachycardia, resolved.  4. Persistent hiccups, resolved.  5. Pleural effusion, improved.  6. Hyperkalemia, resolved.  7. Mild hematemesis, probably secondary to recurrent nausea and      vomiting.   DISCHARGE MEDICATIONS:  1. Lopressor 25 mg p.o. b.i.d.  2. Ultram 50 mg p.o. q.6 h. p.r.n. for abdominal pain.  3. Flagyl 500 mg p.o. t.i.d. for 10 days.  4. Vicodin 5/325 mg p.o. q.6 h. for abdominal pain.   DISPOSITION AND FOLLOWUP:  [name] is discharged home.  He will soon get  an appointment in the Outpatient Clinic, and also he will follow up with  Dr. Lindie Spruce with [facility] in 2-3 weeks.  Please note that  the patient's Lopressor was started, as he was found to be tachycardiac.  Although this was sinus tachycardia from  it was very high, and so we  had to start this.  If his pulse is normal, he does not have any  indication for Lopressor and we can stop it.   FOLLOWUP ISSUES:  1. Abdominal pain, nausea, vomiting, and the patient's intolerance to      food.  The patient is advised not to eat any fatty food.  2. Adjust p.r.n. medication.  3. Diarrhea and response to Flagyl for C. diff colitis.  4. The patient needs surgery, which is cholecystectomy per Dr. Lindie Spruce      for his pseudocyst, is resolved.  Please make sure he has an      appointment.   CONSULTATIONS:  1. [facility] on October 20, 2008.  2. Marta Lamas. Lindie Spruce, MD, with [facility].   PROCEDURE DONE DURING THIS ADMISSION:  1. Abdominal ultrasound done on October 24, 2008, is positive for      cholelithiasis ruled out, evidence of cholecystitis.  2. CT abdomen without contrast done on October 24, 2008, is positive      for pancreatitis with peripancreatic edema and acute fluid      collection along the left side of the stomach, irregularity along      the renal papilla but more on the left than right, which can be a      spurious finding, papillary necrosis.  Mild pelvic ascites.  3. Chest x-ray done on October 25, 2008, is positive for new      bilateral pleural effusion with bilateral atelectasis/consolidation      with mild cardiomegaly.  4. Followup CT scan done on October 30, 2008, is positive for      increasing signs of  peripancreatic inflammatory changes in the      pseudocyst.  There is increasing inflammatory mass and cyst in the      retroperitoneum, it is just at the left psoas muscle, and there is      decreasing pleural effusion bilaterally.  There is decreasing      amount of pelvic fluid and descended left testicle noted in the      upper inguinal canal.   BRIEF HISTORY OF PRESENT ILLNESS:  [name] is a 30 year old gentleman  with a history of pancreatitis who was originally discharged, presented  with hematemesis.  He reports since his discharge he has not had any  alcohol or other medications and he was feeling generally well until  last night and he had been avoiding fatty food, but last night he ate a  [place] food after which he became acutely ill.  He had vomited 6  minutes.  He initially vomited food and then bright red blood then dark  brown thick emesis.  He has diffuse abdominal pain, which has been going  on since last admission.  It is worse with eating and resolves between  meal, at times it is constant.  No fever, weakness, syncope, melena,  hematochezia, diarrhea.  There is mild dysuria.   PHYSICAL EXAMINATION:  VITAL  SIGNS:  Temp 97.6, blood pressure 126/86,  pulse 120, respirations 20, oxygen saturations 100% on room air.  GENERAL:  He is in mild distress, discomfort.  Healthy-appearing male.  EYES:  PERRLA.  Sclerae and conjunctivae pale.  ENT:  Mucous membrane dry.  Oropharynx clear.  NECK:  Supple.  No JVD, no bruit.  CHEST:  Clear to auscultation bilaterally with good air movement.  CARDIOVASCULAR:  Regular rate and rhythm with no murmurs, rubs, or  gallops.  ABDOMEN:  Bowel sound normal, soft, but diffuse tender with guarding but  no rebound.  EXTREMITIES:  No edema.  A 1+ pedal pulses and cold extremities.  BACK:  Bilateral CVA tenderness positive.  NEURO:  Nonfocal.  PSYCH:  Appropriate.   LABORATORY DATA:  On last admission, blood count 8.6 with ANC 4.9,  hemoglobin 13, hematocrit 26, MCV 93.7, platelets 593.  Sodium 139,  potassium 3.1, chloride 102, bicarb 26, BUN 8, creatinine 1, glucose of  180, lipase more than 200.  CT scan finding as mentioned above, Tylenol  level less than 10.  Alcohol level less than 5.  Total bilirubin 2.9,  alk phos 3.19, AST 213, ALT 625, protein 7.7, albumin 3.7, magnesium  2.4.   HOSPITAL COURSE:  1. Acute pancreatitis.  The patient is basically admitted and kept      n.p.o. and was given pain medications.  GI was consulted and Dr.      Elnoria Howard saw the patient and recommended IV fluids and pain medication.      A right upper quadrant was recommended, and if it is positive for      any ductal dilatation, an ERCP was recommended.  An EGD was also      recommended, which was done and was negative for any acute      findings.  A repeat CT was recommended in 2-3 days to assess the      pancreatitis, which was positive for decreasing amount of      peripancreatic inflammation and pseudocyst.  In the interim, the      patient did develop low-grade fever and his white count went up.      He  was transiently started on Zosyn, which we discontinued later.      The  patient was continued to have n.p.o. and was given pain      medication.  After few days, the patient's pain got better.  We      started him on a clear diet, which he tolerated well and then we      advanced the diet to low-fat diet, which he tolerated well.  The      patient is discharged to home after he completely tolerant of low-      fat diet.  He was advised and a formal nutrition consult was done      in order to teach him about low-fat diet.  He is discharged home on      Ultram and Vicodin.  He is to follow up with Dr. Lindie Spruce in 2-3      weeks.  The plan is to do a cholecystectomy for his pseudocyst, is      resolved.  He will also come to [facility] for      followup.  The patient was also started on intravenous feeding TNA      per pharmacy.  2. Tachycardia.  We did EKG, which was positive for sinus rhythm.      This was believed to be secondary to pain and inflammation.  We did      start the patient on beta-blocker.  Since his tachycardia was      uncontrolled, we may need to discontinue the beta-blocker and this      would be assessed in a followup visit in the Outpatient Clinic.  3. Hiccups.  The patient has persistent hiccups for which we gave him      some Thorazine, which helped his hiccup.  We also gave him some      Reglan and Protonix.  4. Pleural effusion.  This was thought to be secondary to      pancreatitis.  This got better as per repeat CT scan after      conservative treatment for pancreatitis.  5. Hyperkalemia.  This was repleted and it was resolved.  6. Disposition.  To Outpatient Clinic.  Please note that the patient's      Lopressor was started, as he was found to be tachycardiac.      Although this was sinus tachycardia, up from prior information it      was very high, and so we had to start this.  If his pulse is      normal, he does not have any indication for Lopressor and we can      stop it.   DISCHARGE VITAL SIGNS:  Temperature  97.7, pulse 90, respirations 20,  blood pressure 103/69, oxygen saturation 99 on room air.   LABORATORY DATA:  On November 02, 2008, sodium 135, potassium 3.8,  chloride 103, bicarb 28.5, glucose 93, BUN 14, creatinine 0.66.  Total  bilirubin 0.7, alkaline phosphatase 205, AST 30, ALT 42, total protein  6.8, albumin 2.5, calcium 8.9.  WBCs 7.4, RBCs 3.65, hemoglobin 11.3,  platelets 386.      Jason Coop, MD  Electronically Signed      Alvester Morin, M.D.  Electronically Signed    YP/MEDQ  D:  11/05/2008  T:  11/06/2008  Job:  [id number]   cc:   Jordan Hawks. Elnoria Howard, MD  Cherylynn Ridges, M.D.

## 2011-04-14 NOTE — Discharge Summary (Signed)
[name], [name]                    ACCOUNT NO.:  [id number]   MEDICAL RECORD NO.:  [id number]          PATIENT TYPE:  INP   LOCATION:  1307                         FACILITY:  [facility]   PHYSICIAN:  Theodosia Paling, MD    DATE OF BIRTH:  12-15-1980   DATE OF ADMISSION:  12/13/2008  DATE OF DISCHARGE:  12/21/2008                               DISCHARGE SUMMARY   PRIMARY CARE PHYSICIAN:  Unassigned.   ADMITTING HISTORY:  Please refer to text and admission note, dictated by  Dr. Vania Rea, dated December 13, 2008, under History of Present  Illness.   DISCHARGE DIAGNOSES:  1. Recurrent biliary pancreatitis.  2. Cholelithiasis, status post cholecystectomy.  3. Alcohol abuse.  4. Hypertension.   DISCHARGE MEDICATIONS INCLUDED:  1. Toprol XL 25 mg p.o. daily.  2. Omeprazole 40 mg p.o. daily.  3. Tramadol 50 mg p.o. q.6h p.r.n.  4. Phenergan 12.5 mg p.o. q.4h p.r.n.   NEW MEDICATIONS ADDED:  1. Ciprofloxacin 500 mg p.o. q.12h for one week.  2. Percocet 5/325 mg p.o. q.6h p.r.n. if the pain is not resolved by      Tramadol.   HOSPITAL COURSE:  The following issues were addressed during the  hospitalization.   1. Acute biliary pancreatitis:  Patient received IV fluids, pain      medications and underwent surgical evaluation.  Surgery recommended      cholecystectomy.  Patient underwent cholecystectomy.  At the time      of discharge, patient is asymptomatic, able to keep food down and      is hemodynamically stable.  2. Cholelithiasis:  Patient underwent cholecystectomy on December 16, 2008 by Dr. Johna Sheriff.  Postoperatively, he did have nausea and      right upper quadrant pain for two days.  However, CT scan did not      show any acute pathology.  At the time of discharge, patient's pain      is significantly better.  He is also able to keep food down.  3. Hypertension:  His blood pressure was stable as his medications      were continued.  4. Alcohol abuse issue:  He did  not have any evidence of withdrawal or      delirium.   PROCEDURES PERFORMED:  Cholangiogram on December 16, 2008.   Cholecystectomy, laparoscopic, by Dr. Johna Sheriff.   On January 18, performed CT scan of the abdomen with contrast, on  December 13, 2008, showing benign appearance of the abdomen and chest.   CT scan of the abdomen and pelvis, done on January 21, showing small  pneumoperitoneum. A/C Pancreatitis, Colonic inflammation of hepatic and  splenic flexure, reaction to pancreatitis.   Ultrasound of the abdomen, done on December 13, 2008, showing  cholelithiasis, right perihepatic ascites, no Murphy sign.   Time spent on discharge of this patient:  45 minutes.   DISPOSITION:  1. Patient will establish with primary care physician and follow up      with him in one week's time, which will be Dr. Mikeal Hawthorne.  2. Patient will follow with Dr. Johna Sheriff in two to three weeks' time.      He is status post laparoscopic cholecystectomy.      Theodosia Paling, MD  Electronically Signed     NP/MEDQ  D:  12/21/2008  T:  12/21/2008  Job:  [id number]   cc:   Lorne Skeens. Hoxworth, M.D.  [address]  [place]  [place] [address]   Lonia Blood, M.D.

## 2011-04-14 NOTE — Consult Note (Signed)
NAME:  [name], [name]                    ACCOUNT NO.:  [id number]   MEDICAL RECORD NO.:  [id number]          PATIENT TYPE:  INP   LOCATION:  6524                         FACILITY:  [facility]   PHYSICIAN:  Jordan Hawks. Elnoria Howard, MD    DATE OF BIRTH:  [birth year]   DATE OF CONSULTATION:  10/20/2008  DATE OF DISCHARGE:                                 CONSULTATION   REASON FOR CONSULTATION:  Pancreatitis.   REFERRING PHYSICIAN:  Teaching Service.   HISTORY OF PRESENT ILLNESS:  This is a 30 year old gentleman with a  recent history of pancreatitis who was admitted to the hospital with  complaints of abdominal pain.  The patient states that his pain started  acutely after eating a 'heavy meal.  Previously, when he was admitted  on October 09, 2008, the patient had similar episodes of pain.  At that  time, he was again eating a heavy meal and subsequently the pain was  brought on and he was noted to have a marked elevation in his lipase  initially upon presentation at 1000 range and subsequently over the next  day, it did decrease down to 400 range.  The patient was also noted to  have elevations in his transaminases and with AST and ALT in the 100s,  but on the following day, they had markedly dropped down almost  normalizing.  The patient has a history of drinking alcohol  approximately 4-5, 24-40 ounce beers per day, however, per his report,  he has not had any alcohol for the past 14 days and at the time of his  initial presentation on October 09, 2008, there did not appear to be  any significant alcohol use and the alcohol level was drawn on October 09, 2008, and was undetectable at that time.  During the prior  admission, the patient's abdominal pain did completely resolved and he  is able to be discharged to home without any significant treatment aside  from supportive care.  The patient also had vomiting associated with the  abdominal pain, subsequently is coffee-ground emesis.   PAST MEDICAL  HISTORY:  As stated above.   PAST SURGICAL HISTORY:  As stated above.   FAMILY HISTORY:  Noncontributory.   SOCIAL HISTORY:  Positive for alcohol, negative for illicit drug use,  and positive for tobacco use.   REVIEW OF SYSTEMS:  As stated above in the history of present illness,  otherwise negative.   HOME MEDICATIONS:  None.   HOSPITAL MEDICATIONS:  1. Dilaudid.  2. Reglan.  3. Zofran.  4. Protonix.  5. Morphine 2-4 mg IV q.4 h. p.r.n.   ALLERGIES:  No known drug allergies.   PHYSICAL EXAMINATION:  VITAL SIGNS:  Blood pressure is 126/86, heart  rate is 120, respirations 20, and pulse ox is 100% on room air.  Temperature is 97.6.  GENERAL:  The patient is no acute distress, although he is alert and  oriented.  HEENT:  Normocephalic and atraumatic.  Extraocular muscles intact.  NECK:  Supple.  No lymphadenopathy.  LUNGS:  Clear to auscultation bilaterally.  CARDIOVASCULAR:  Regular rate and rhythm.  ABDOMEN:  Flat, soft, and is tender in the epigastric region.  No  rebound or rigidity.  Positive bowel sounds.  EXTREMITIES:  No clubbing, cyanosis, or edema.   LABORATORY VALUES:  White blood cell count is 8.6, hemoglobin 16, MCV is  93.4, and platelets at 593.  Sodium is 139, potassium 3.1, chloride is  102, CO2 is 180, BUN is 8, creatinine 1.0, total bilirubin 2.9, alk phos  is 319, AST 213, ALT is 325, and lipase is greater than 2000.  Tylenol  level less than 10.  Alcohol level is at 5.   IMPRESSION:  1. Gallstone pancreatitis.  2. Coffee-ground emesis.  3. History of alcohol use.   After evaluation of  the patient and close review of the patient's  laboratory values it is more apparent that the patient has a gallstone  pancreatitis.  His ALT is elevated and is highly predictive of a  gallstone pancreatitis.  He also has elevation in his total bilirubin.  The prior ultrasound did reveal cholelithiasis without any ductal  dilation.  CT scan did not specifically  mention the pancreas at that  time, although there is mention of some fluid around the renal region  consistent with an acute pancreatitis.  Although review of the pancreas  on CT scan appears to be intact, but that there is no official comment  about his pancreatitis at that time.   PLAN:  1. Overall fluid intake through the IV, he should receive at least 2 L      with bolus.  2. I agreed with the right upper quadrant ultrasound to recheck if      there is any ductal dilation, if this is positive then an ERCP      above be required to extract any stones.  3. A repeat CT scan in 2-3 days will be beneficial to help to re-      evaluate his pancreas in light of his acute onset of the      pancreatitis again.  4. An EGD will be performed in the morning in order to rule out any      other sources for GI bleeding in regard to his coffee-ground      emesis, however, it appears that this may be secondary to [employer] tear.  5. If the patient does not have any CBD stones, then a surgical      consultation for cholecystectomy will be required.      Jordan Hawks Elnoria Howard, MD  Electronically Signed     PDH/MEDQ  D:  10/20/2008  T:  10/20/2008  Job:  [id number]

## 2011-04-14 NOTE — Op Note (Signed)
NAMEIVAAN, [name]                    ACCOUNT NO.:  [id number]   MEDICAL RECORD NO.:  [id number]          PATIENT TYPE:  INP   LOCATION:  1307                         FACILITY:  [facility]   PHYSICIAN:  Sharlet Salina T. Hoxworth, M.D.DATE OF BIRTH:  28-Jun-1981   DATE OF PROCEDURE:  12/16/2008  DATE OF DISCHARGE:                               OPERATIVE REPORT   POSTOPERATIVE DIAGNOSIS:  Recurrent gallstone pancreatitis.   SURGICAL PROCEDURES:  Laparoscopic cholecystectomy with intraoperative  cholangiogram.   SURGEON:  Dr. Johna Sheriff.   ASSISTANT:  Dr. Estelle Grumbles.   ANESTHESIA:  General.   BRIEF HISTORY:  Mr. Gilardi is a 30 year old male originally from [place]  who, since middle of last year, has had repeated episodes of  pancreatitis.  Initially, he was consuming alcohol and there was some  uncertainty as to the etiology of his pancreatitis.  He did also have  gallstones.  The patient at one point had a pancreatic pseudocyst that  resolved on follow-up CT scan.  He has cholelithiasis on ultrasound.  He  has had several recent hospitalizations for recurrent pancreatitis and  denies any alcohol.  Plans were to proceed with laparoscopic  cholecystectomy.  His pancreatitis improved but he again came back to  the hospital 2 days ago with recurrent abdominal pain and elevated  lipase which quickly returned to normal.  CT scan has shown some edema  around the pancreas but no pseudocyst or marked pancreatitis.  I have  recommended at this point proceeding with laparoscopic cholecystectomy  with cholangiogram due to apparent frequently recurrent gallstone  pancreatitis.  The nature of the procedure, its indications, risks of  bleeding, infection, leakage of bile, possible need for open procedure  and bile duct injury were discussed and understood.  The patient is now  brought to the operating room for this procedure.   DESCRIPTION OF OPERATION:  The patient was brought to the operating room  and  placed in the supine position on the operating table and general  endotracheal anesthesia was induced.  The abdomen was widely sterilely  prepped and draped.  PAS stockings were in place.  He received  preoperative IV antibiotics.  Correct patient and procedure were  verified.  Trocar sites were anesthetized with local anesthesia.  A 1 cm  incision was made in the umbilicus and dissection carried down to the  midline fascia which was incised for 1 cm and the peritoneum entered  under direct vision.  Through a mattress suture of 0-Vicryl, the  assigned trocar was placed and pneumoperitoneum established.  Under  direct vision, an 11 mm trocar was placed in the subxiphoid area and two  5 mm trocars on the right subcostal margin.  The gallbladder was  slightly edematous and distended, but not acutely inflamed.  There was  some prominence over the pancreas and transverse colon that made  visualization down around the infundibulum a little difficult.  However,  with a 30 degrees scope, visualization was very good.  There were some  chronic inflammatory adhesions up to the infundibulum which were  carefully taken down.  There seemed to be some adhesions in the duodenum  up to the infundibulum as well, but these were filmy and easily taken  down with careful blunt dissection.  The infundibulum was then  completely cleared and retracted inferolaterally.  Fibrofatty tissue was  stripped off the neck of the gallbladder toward the porta hepatis and  peritoneum, anterior-posterior, and Calot's triangle was incised.  The  distal gallbladder was dissected and there was again some chronic  inflammation around the duct and artery, but we were able to dissect  this out without too much difficulty, encircle the cystic duct at the  gallbladder junction and dissect the cystic duct out over about a  centimeter.  The anatomy appeared clear.  The cystic duct was clipped at  the gallbladder junction and an  operative cholangiogram obtained via the  cystic duct.  This showed good filling of normal common bile duct and  intrahepatic ducts with free flow into the duodenum and no filling  defects.  Following this, the cholangiocath was removed and the cystic  duct was triply clipped proximally and divided.  The cystic artery was  dissected out just posterior to this and divided between clips.  The  gallbladder was then dissected free from its bed using hook cautery and  removed via an EndoCatch bag.  There was some raw surface of bleeding  along the liver bed that was controlled with cautery.  The right upper  quadrant was then thoroughly irrigated and suctioned until clear and  hemostasis obtained.  A Surgicel pack was left in the gallbladder fossa.  Trocars were then removed and all CO2 evacuated and the mattress sutures  secured at the umbilicus.  Skin incisions were closed with subcuticular  Monocryl and Dermabond.  Sponge, needle and instrument counts were  correct.  The patient was taken to recovery in good condition.      Lorne Skeens. Hoxworth, M.D.  Electronically Signed     BTH/MEDQ  D:  12/16/2008  T:  12/16/2008  Job:  [id number]

## 2011-04-14 NOTE — H&P (Signed)
NAME:  [name], [name]                    ACCOUNT NO.:  [id number]   MEDICAL RECORD NO.:  [id number]          PATIENT TYPE:  INP   LOCATION:  1230                         FACILITY:  [facility]   PHYSICIAN:  Richarda Overlie, MD       DATE OF BIRTH:  [birth year]   DATE OF ADMISSION:  11/24/2008  DATE OF DISCHARGE:                              HISTORY & PHYSICAL   Primary care physician  Unassigned.   OBJECTIVE:  This is a 30 year old male who presents to the ER with a  chief complaint of abdominal pain.  The patient states that his  abdominal pain was associated intractable nausea and vomiting that  started about 10 hours ago associated with vomiting of nonbilious,  coffee-ground emesis and fecal material, countless episodes in the last  10 hours.  The patient's symptoms were fairly acute onset.  The patient  has had similar symptoms for which she has been admitted on November 21  and December 19.  The patient initially presented on November 10 for  what was found to be gallstone pancreatitis.  However, the possibility  of secondary alcohol abuse could not be ruled out at that time.  The  patient responded well to medical treatment.  He then presented on  November 21 with similar symptoms.  At that point also, the patient was  found to have biliary pancreatitis with elevation in his ALT.  At that  time the patient also had hematemesis and an EGD was done which was  negative for any acute findings.  A repeat CT scan showed very  pancreatic inflammation and a pseudocyst.  The plan was to follow up  with surgery, Dr. Daphine Deutscher, in two to three weeks for outpatient  cholecystectomy once the pseudocyst resolved.  The patient presented  again on December 19 with similar symptoms at which point he had normal  liver function.  He presents today with a lipase of greater than 2000  and elevated liver function tests and appeared to be very uncomfortable  because of the above symptoms.   FAMILY HISTORY:   Noncontributory.   PAST MEDICAL HISTORY:  1. Prior history of pancreatitis.  2. Alcohol abuse.  3. Cholelithiasis.  4. Undescended testes.  5. Tobacco abuse.   SURGICAL HISTORY:  1. EGD done in November 2009.  2. Laparoscopic abdominal surgery in the past.  The patient is unclear      as to what the indication was.   SOCIAL HISTORY:  The patient states that he is married and he has 5  children and the oldest one is 12 years and the youngest one is 4.  He  has immigrated from [place] in 2001.  He is a former tobacco abuser and  smokes a pack a day for 7 to 8 years.  He also states that he used to  drink 4 to 5 drinks of alcohol a day but has quit since the beginning of  November.   ALLERGIES:  No known drug allergies.   MEDICATIONS:  1. Toprol XL 25 mg twice a day.  2. Metoprolol  25 mg twice a day.  3. Vicodin 1-2 tablets every 4 to 6 hours.  4. Ultram 50 mg p.o. daily.  5. Flagyl 500 mg t.i.d.   Physical exam  Vitals 165/60, 97 %on RA , 100,  General: in mild to moderate distress because of abdominal pain  HEENT : pupils equal and reactive , EOMI ,neck supple  Lungs CTA , bilaterally , no wheezing, crackles,  CVS : regular rate and rythm  Abdomen : diffuse abdominal pain , with guarding and rebound  Neurologic CN2-12 intact  skin no rashes   LABORATORY DATA:  A CT scan of the abdomen and pelvis with contrast  shows severe acute pancreatitis, resolution of the pseudocyst, secondary  inflammation of several loops of jejunum in the left upper quadrant,  consistent with ileus, marked enhancement of the wall of the gallbladder  and common bile duct suggestive of cholangitis,  interval decrease in  the size of the phlegmon inflammatory mass.   WBC 9.4, hemoglobin 15.7, hematocrit 46.4.  Sodium 140, potassium 3,  chloride 99, glucose 186, BUN 6, creatinine 0.92, total bili 3.3, alk  phos 291, AST 171, ALT 332, lipase greater than 2000.  Urinalysis  negative.    ASSESSMENT/PLAN:  1. 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  The patient will be      admitted to the ICU.  Will start the patient on aggressive IV      hydration.  I will cover the patient with antibiotic for the      possibility of cholangitis.  The patient does not have an elevated      white count but is currently hypothermic which could be a sign of      sepsis.  Blood cultures will be obtained x2.  I have called GI at      [phone].  Dr. Cira Servant is on call tonight and requested her to see      the patient for possible ERCP.  The patient will also have a right      upper quadrant ultrasound in the morning to rule out gallbladder /      CBD dilatation.  After discussion with Dr Cira Servant , there is no need      for an urgent  ERCP, unless patient gets really septic .  2. Intractable nausea and vomiting with coffee-ground emesis.  This is      probably related to the ileus secondary to pancreatitis , alcoholic      gastritis, varices or mallory vise tear.  Will continue with      nasogastric tube suctioning, proton pump inhibitor q 12 hours,      monitoring of his vital signs in the  step-down unit.  Should      resolve as the pancreatic inflammation resolves.  Continue with      Phenergan and Reglan for treating the symptoms.  3. Hypokalemia.  This will be supplemented with IV fluids.  4. N.p.o. for now until GI consultation has been obtained.  Ultimately      the patient will probably need a surgical consultation and a      definite plan for cholecystectomy.      Richarda Overlie, MD  Electronically Signed     NA/MEDQ  D:  11/24/2008  T:  11/24/2008  Job:  [id number]

## 2011-04-14 NOTE — Consult Note (Signed)
NAME:  [name], [name] NO.:  [id number]   MEDICAL RECORD NO.:  [id number]          PATIENT TYPE:  INP   LOCATION:  1230                         FACILITY:  [facility]   PHYSICIAN:  Juanetta Gosling, MDDATE OF BIRTH:  07-Jul-1981   DATE OF CONSULTATION:  11/25/2008  DATE OF DISCHARGE:                                 CONSULTATION   This is a 30 year old male, who is not the best historian, who has  multiple episodes of pancreatitis requiring hospital admission in the  past.  He had been previously seen by Dr. Lindie Spruce in my practice and was  supposed to follow up with him for evaluation for a laparoscopic  cholecystectomy for biliary pancreatitis.  He came to the ER again on  the 26th of December with abdominal pain associated with nausea and  vomiting.  His lipase was greater than 2000 at this point.  He had an  ileus and an NG tube was placed, and he was admitted to the intensive  care unit.  I see him this morning where he still continues to complain  of hiccups as well as significant abdominal pain.  He does have a  history of heavy alcohol use, but states he has not done this in about 3  months.   PAST MEDICAL HISTORY:  1. Pancreatitis.  2. Cholelithiasis.  3. History of tobacco and alcohol abuse.   PAST SURGICAL HISTORY:  1. He had a laparoscopic surgery for unknown reasons.  2. He also had an EGD in November 2009.   SOCIAL HISTORY:  He is married, he immigrated from [place], he speaks  [place] well.   No known drug allergies.   MEDICATIONS:  1. Toprol 25 b.i.d.  2. Vicodin.  3. Ultram.   MEDICATIONS IN THE HOSPITAL:  Today include Ambien, Protonix, and Zosyn.   PHYSICAL EXAMINATION:  VITAL SIGNS:  A temp of 98.4, heart rate of 125,  blood pressure 128/88, he is 14 and 100 on 2 L.  GENERAL:  He is a thin male with hiccups.  CARDIOVASCULAR:  Tachycardic regular rhythm.  PULMONARY:  Clear bilaterally.  ABDOMEN:  Tender in the epigastrium, well-healed  scars.   His BMP:  Sodium 139, potassium 3.9, chloride 102, CO2 is 29, BUN 3,  creatinine 0.65, glucose 106.  White blood cell count 5.6, hematocrit  38, platelets 201.  His lipase is 869 today; it was greater than 2000  initially.  His albumin is 2.7, alk-phos 164, AST 51, ALT 164, total  bilirubin is 1.4.  On the 26th of December, he underwent a right upper  quadrant ultrasound showing multiple gallstones and dilated ducts.  A CT  scan for the same day shows no pancreatic necrosis, but very significant  peripancreatic inflammation with inflammatory mass adjacent to his left  psoas, and there is also a secondary inflammation of his small bowel in  accordance with his ileus.   ASSESSMENT AND PLAN:  1. Severe pancreatitis likely of biliary origin.  We will continue      resuscitation and treatment per the primary team and  Dr. Loreta Ave and      wait for his pancreatitis to resolve.  2. His severe pancreatitis may or may not end up being managed by      cholecystectomy in the hospital.  He does need his gallbladder      removed.  We will continue to follow him during this      hospitalization and see how he resolves this pancreatitis and      depending on what his inflammation does during this time as well.      Juanetta Gosling, MD  Electronically Signed     MCW/MEDQ  D:  11/25/2008  T:  11/25/2008  Job:  [id number]

## 2011-04-14 NOTE — Consult Note (Signed)
[name] NO.:  [id number]   MEDICAL RECORD NO.:  [id number]          PATIENT TYPE:  INP   LOCATION:  3731                         FACILITY:  [facility]   PHYSICIAN:  [name], M.D.    DATE OF BIRTH:  1981/09/08   DATE OF CONSULTATION:  10/23/2008  DATE OF DISCHARGE:                                 CONSULTATION   TIME OF CONSULTATION:  11:55 a.m.   REQUESTING PHYSICIAN:  [name], [name].   CONSULTING SURGEON:  [name].   GASTROENTEROLOGIST:  [name]. [name], [name]   REASON FOR CONSULTATION:  Gallstone pancreatitis.   HISTORY OF PRESENT ILLNESS:  [name] is a 30 year old male who does not  appear to me to be a good historian and cannot give me many details  about why he is here except for he presented to the emergency department  on November, 21, 2009, with abdominal pain.  According to the chart, the  patient was admitted on October 09, 2008, with pancreatitis and a  lipase of 1013.  At that time, he was kept here for 4 days and treated  for pancreatitis which eventually resolved.  At that time, he was also  found to have cholelithiasis.  However, since his pancreatitis resolved  and the patient was feeling better, he was sent home at that time.  Also  of note, the patient is known to have a history of alcohol use.  However, the patient states that he has quit alcohol for a long time  but when further inquired about this, he has only stopped drinking  alcohol for the last 16 days.  The patient apparently ate a heavy meal  of Chinese food and began having abdominal pain once again and presented  back to the emergency department on October 20, 2008.  At this time,  his lipase was found to be greater than 2000 along with a total  bilirubin of 2.9 and direct bilirubin of 1.6 and alkaline phosphatase of  319 and AST of 213 and ALT of 325.  At this time, he was admitted and  also found to have some hematemesis due to nausea.  Therefore, GI was  consulted, and an EGD was performed to workup his hematemesis.  The EGD  was performed and was normal.  At this time, the patient's lipase has  come down and is now 305 along with normal LFTs except for still a  slight elevation in alkaline phosphatase of 135 and ALT at 80.  He has  had an ultrasound done on this admission which showed cholelithiasis but  no common bile duct dilatation or cholecystitis.  At this time, we were  consulted to see the patient for possible surgical intervention.   REVIEW OF SYSTEMS:  Please see HPI, otherwise, all other systems are  currently negative.   FAMILY HISTORY:  Noncontributory.   PAST MEDICAL HISTORY:  1. Prior history of pancreatitis.  2. Alcohol abuse.  3. Cholelithiasis.  4. Undescended testicle.  5. Tobacco abuse.   PAST SURGICAL HISTORY:  The patient has  had an abdominal surgery in the  past that appears to be laparoscopic; however, he states he does not  have any idea what surgery this is.   SOCIAL HISTORY:  The patient states that he is married.  He states that  he has 5 children.  He says the oldest is 38 and the youngest is 4;  however, looking at the chart, it says his social history is consistent  with him being a single male who lives alone and emigrated from [place]  in 2001.  He was a former tobacco abuser and smoked approximately a pack  a day for 7 to 8 years but quit 14 days ago.  He also states that he  used to drink approximately 4-5 drinks of alcohol a day, 20-40 ounce of  beers per day; however, he quit approximately 16 days ago.   ALLERGIES:  No known drug allergies.   MEDICATIONS:  He reports to me that he was not on any at home; however,  his admitting history and physical states that he was on:  1. Ambien 5 mg nightly.  2. Zofran 4 mg q.6 h. p.r.n. for nausea.  3. Ultram 50 mg q.6 h. p.r.n. pain.   PHYSICAL EXAMINATION:  GENERAL:  [name] is a 30 year old male who is  currently lying in bed, in no acute  distress, and he is well developed  and well nourished.  VITAL SIGNS:  Temperature 99.7, pulse 133, respirations 16, blood  pressure 149/59.  HEENT:  Head is normocephalic and atraumatic.  Sclerae noninjected.  Pupils are equal, round, and reactive to light.  Ear and nose are  without any obvious masses or lesions.  No rhinorrhea.  Mouth is pink  and moist.  Throat shows no exudate.  NECK:  Supple.  Trachea is midline.  No thyromegaly.  HEART:  Regular rhythm, however, he is tachycardic with a heart rate in  the 130s, otherwise, no murmurs, gallops, or rubs are noted.  He has +2  carotid, radial, and pedal pulses bilaterally.  LUNGS:  Clear to auscultation bilaterally with no wheezes, rhonchi, or  rales noted.  Respiratory effort is nonlabored.  ABDOMEN:  Soft but seems mildly distended; otherwise, he does have  active bowel sounds and is tender in both upper quadrants of his abdomen  but mildly.  Otherwise, he does have a small laparoscopic scar noted at  his umbilicus and 2 others are noted that may indicate that he had an  appendectomy; however, this cannot be confirmed as the patient does not  know.  MUSCULOSKELETAL:  All 4 extremities are symmetrical with no cyanosis,  clubbing, or edema.  SKIN:  Warm and dry without any obvious rashes, lesions, or masses.  NEUROLOGIC:  Cranial nerves II-XII appear to be grossly intact.  PSYCH:  The patient is alert and oriented with a somewhat flat affect  and appears to be sleepy after having pain medicines.  However, it does  raise concern that the patient may be confused since he is not giving a  consistent history as far as his social history and telling me that he  is married and has 5 children; however, he is telling everyone else that  he is single and lives alone.   LABORATORY DATA AND DIAGNOSTICS:  Upon admission, the patient had a  total bilirubin of 2.9, alkaline phosphatase 318, AST 213, ALT 325.  A  WBC of 8600, hemoglobin of 15.6,  hematocrit 46.3, and platelet count of  593,000.  He  also had an alcohol blood test done which was at 5.   Today's labs show a white blood cell count of 12,200, hemoglobin of  13.5, hematocrit of 39.3, and neutrophil count of 91%.  A lipase from  yesterday was 305, and LFTs from yesterday are total bilirubin of 1.1,  alkaline phosphatase of 135, AST 28, ALT 80.  Sodium 134, potassium 3.8,  glucose 113, BUN 4, creatinine 0.75.   Diagnostics, an ultrasound of the abdomen shows cholelithiasis without  evidence of cholecystitis, otherwise, is unremarkable.   IMPRESSION:  1. Biliary pancreatitis, secondary to gallstones.  2. Tachycardia.   PLAN:  At this time, I will make the patient n.p.o. after midnight in  preparation for surgical intervention tomorrow to remove the patient's  gallbladder.  I will also obtain another lipase checked in the morning  to make sure that this is continuing to trend down.  Otherwise, I will  discuss this with [name], but since the patient's white count is  beginning to rise and he has a mild low-grade fever, I question if the  patient may need to be placed on antibiotics now such as Unasyn or we  just want to give the patient a prophylactic dose of Ancef and call to  the OR tomorrow.  Otherwise, we will have the patient sign a consent  form for laparoscopic cholecystectomy with intraoperative cholangiogram.  This has been explained to the patient and at this time he wishes to  proceed with this plan.      Andres Cape, [name]      [name], M.D.  Electronically Signed    KEO/MEDQ  D:  10/23/2008  T:  10/24/2008  Job:  [id number]

## 2011-08-19 ENCOUNTER — Emergency Department (HOSPITAL_COMMUNITY)
Admission: EM | Admit: 2011-08-19 | Discharge: 2011-08-19 | Disposition: A | Discharge status: Home / Self Care | Payer: BC Managed Care – PPO | Attending: Emergency Medicine | Admitting: Emergency Medicine

## 2011-08-19 DIAGNOSIS — Z87891 Personal history of nicotine dependence: Secondary | ICD-10-CM | POA: Insufficient documentation

## 2011-08-19 DIAGNOSIS — F411 Generalized anxiety disorder: Secondary | ICD-10-CM | POA: Insufficient documentation

## 2011-09-01 LAB — CBC
HCT: 33.1 % — ABNORMAL LOW (ref 39.0–52.0)
HCT: 35.1 % — ABNORMAL LOW (ref 39.0–52.0)
HCT: 37 % — ABNORMAL LOW (ref 39.0–52.0)
HCT: 37.9 % — ABNORMAL LOW (ref 39.0–52.0)
HCT: 39.3 % (ref 39.0–52.0)
HCT: 43.6 % (ref 39.0–52.0)
HCT: 44.9 % (ref 39.0–52.0)
HCT: 42.6
Hemoglobin: 11.3 g/dL — ABNORMAL LOW (ref 13.0–17.0)
Hemoglobin: 11.8 g/dL — ABNORMAL LOW (ref 13.0–17.0)
Hemoglobin: 12.1 g/dL — ABNORMAL LOW (ref 13.0–17.0)
Hemoglobin: 12.8 g/dL — ABNORMAL LOW (ref 13.0–17.0)
Hemoglobin: 13.5 g/dL (ref 13.0–17.0)
Hemoglobin: 15.5 g/dL (ref 13.0–17.0)
Hemoglobin: 15.6 g/dL (ref 13.0–17.0)
Hemoglobin: 14.2
Hemoglobin: 14.5
Hemoglobin: 15.1
Hemoglobin: 16.3
MCHC: 33.8 g/dL (ref 30.0–36.0)
MCHC: 34.1 g/dL (ref 30.0–36.0)
MCHC: 34.2 g/dL (ref 30.0–36.0)
MCHC: 34.3 g/dL (ref 30.0–36.0)
MCHC: 34.5 g/dL (ref 30.0–36.0)
MCHC: 34.5 g/dL (ref 30.0–36.0)
MCHC: 34.6 g/dL (ref 30.0–36.0)
MCHC: 34.8
MCHC: 35.3
MCHC: 35.5
MCHC: 35.5
MCV: 91 fL (ref 78.0–100.0)
MCV: 91.4 fL (ref 78.0–100.0)
MCV: 91.9 fL (ref 78.0–100.0)
MCV: 92.1 fL (ref 78.0–100.0)
MCV: 92.4 fL (ref 78.0–100.0)
MCV: 91.1
MCV: 93
Platelets: 304 10*3/uL (ref 150–400)
Platelets: 344 10*3/uL (ref 150–400)
Platelets: 362 10*3/uL (ref 150–400)
Platelets: 453 10*3/uL — ABNORMAL HIGH (ref 150–400)
Platelets: 454 10*3/uL — ABNORMAL HIGH (ref 150–400)
Platelets: 491 10*3/uL — ABNORMAL HIGH (ref 150–400)
Platelets: 161
Platelets: 182
Platelets: 202
RBC: 3.6 MIL/uL — ABNORMAL LOW (ref 4.22–5.81)
RBC: 4.04 MIL/uL — ABNORMAL LOW (ref 4.22–5.81)
RBC: 4.38 MIL/uL (ref 4.22–5.81)
RBC: 4.9 MIL/uL (ref 4.22–5.81)
RBC: 4.95 MIL/uL (ref 4.22–5.81)
RBC: 4.35
RDW: 13.2 % (ref 11.5–15.5)
RDW: 13.4 % (ref 11.5–15.5)
RDW: 13.5 % (ref 11.5–15.5)
RDW: 13.6 % (ref 11.5–15.5)
RDW: 13.8 % (ref 11.5–15.5)
RDW: 14 % (ref 11.5–15.5)
RDW: 14 % (ref 11.5–15.5)
RDW: 13.1
RDW: 13.3
RDW: 13.4
RDW: 13.4
RDW: 13.5
WBC: 10.1 10*3/uL (ref 4.0–10.5)
WBC: 11.8 10*3/uL — ABNORMAL HIGH (ref 4.0–10.5)
WBC: 16 10*3/uL — ABNORMAL HIGH (ref 4.0–10.5)
WBC: 7.5 10*3/uL (ref 4.0–10.5)
WBC: 3.6 — ABNORMAL LOW
WBC: 7.5

## 2011-09-01 LAB — DIFFERENTIAL
Basophils Absolute: 0 10*3/uL (ref 0.0–0.1)
Basophils Absolute: 0 10*3/uL (ref 0.0–0.1)
Basophils Absolute: 0 10*3/uL (ref 0.0–0.1)
Basophils Absolute: 0 10*3/uL (ref 0.0–0.1)
Basophils Absolute: 0
Basophils Relative: 0 % (ref 0–1)
Basophils Relative: 0 % (ref 0–1)
Basophils Relative: 1 % (ref 0–1)
Eosinophils Relative: 0 % (ref 0–5)
Eosinophils Relative: 2 % (ref 0–5)
Eosinophils Relative: 1
Lymphocytes Relative: 10 % — ABNORMAL LOW (ref 12–46)
Lymphocytes Relative: 35 % (ref 12–46)
Lymphocytes Relative: 5 % — ABNORMAL LOW (ref 12–46)
Lymphocytes Relative: 12
Lymphocytes Relative: 22
Lymphs Abs: 0.8
Monocytes Absolute: 0.5 10*3/uL (ref 0.1–1.0)
Monocytes Absolute: 0.6 10*3/uL (ref 0.1–1.0)
Monocytes Absolute: 0.8 10*3/uL (ref 0.1–1.0)
Monocytes Absolute: 0.8 10*3/uL (ref 0.1–1.0)
Monocytes Absolute: 0.2
Monocytes Relative: 10 % (ref 3–12)
Monocytes Relative: 5 % (ref 3–12)
Monocytes Relative: 7 % (ref 3–12)
Monocytes Relative: 6
Neutro Abs: 4.9 10*3/uL (ref 1.7–7.7)
Neutro Abs: 6.5 10*3/uL (ref 1.7–7.7)
Neutro Abs: 6.8 10*3/uL (ref 1.7–7.7)
Neutro Abs: 2.5
Neutro Abs: 3.5
Neutrophils Relative %: 58 % (ref 43–77)
Neutrophils Relative %: 83 — ABNORMAL HIGH

## 2011-09-01 LAB — LIPID PANEL
Cholesterol: 178
HDL: 43
Triglycerides: 59

## 2011-09-01 LAB — URINE MICROSCOPIC-ADD ON

## 2011-09-01 LAB — BASIC METABOLIC PANEL
BUN: 8 mg/dL (ref 6–23)
BUN: 3 — ABNORMAL LOW
CO2: 21 mEq/L (ref 19–32)
CO2: 23 mEq/L (ref 19–32)
CO2: 23
CO2: 25
CO2: 28
CO2: 29
Calcium: 7.3 mg/dL — ABNORMAL LOW (ref 8.4–10.5)
Calcium: 7.9 mg/dL — ABNORMAL LOW (ref 8.4–10.5)
Calcium: 8.4 mg/dL (ref 8.4–10.5)
Calcium: 7.8 — ABNORMAL LOW
Calcium: 9.1
Chloride: 100
Chloride: 102
Chloride: 104
Creatinine, Ser: 0.62 mg/dL (ref 0.4–1.5)
Creatinine, Ser: 0.7 mg/dL (ref 0.4–1.5)
GFR calc Af Amer: >60 mL/min (ref >60)
GFR calc Af Amer: >60 mL/min (ref >60)
GFR calc Af Amer: >60
GFR calc Af Amer: >60
GFR calc non Af Amer: >60 mL/min (ref >60)
GFR calc non Af Amer: >60 mL/min (ref >60)
GFR calc non Af Amer: >60 mL/min (ref >60)
GFR calc non Af Amer: >60
GFR calc non Af Amer: >60
Glucose, Bld: 98 mg/dL (ref 70–99)
Glucose, Bld: 103 — ABNORMAL HIGH
Glucose, Bld: 111 — ABNORMAL HIGH
Glucose, Bld: 119 — ABNORMAL HIGH
Potassium: 2.9 mEq/L — ABNORMAL LOW (ref 3.5–5.1)
Potassium: 3.5 mEq/L (ref 3.5–5.1)
Potassium: 3.5
Potassium: 3.7
Sodium: 134 mEq/L — ABNORMAL LOW (ref 135–145)
Sodium: 136 mEq/L (ref 135–145)
Sodium: 138 mEq/L (ref 135–145)
Sodium: 133 — ABNORMAL LOW
Sodium: 135
Sodium: 138

## 2011-09-01 LAB — PHOSPHORUS
Phosphorus: 2.3 mg/dL (ref 2.3–4.6)
Phosphorus: 2.7 mg/dL (ref 2.3–4.6)
Phosphorus: 3.6 mg/dL (ref 2.3–4.6)

## 2011-09-01 LAB — URINALYSIS, ROUTINE W REFLEX MICROSCOPIC
Bilirubin Urine: NEGATIVE
Glucose, UA: NEGATIVE mg/dL
Hgb urine dipstick: NEGATIVE
Leukocytes, UA: NEGATIVE
Nitrite: NEGATIVE
Nitrite: NEGATIVE
Protein, ur: NEGATIVE mg/dL
Specific Gravity, Urine: 1.01 (ref 1.005–1.030)
Specific Gravity, Urine: 1.028
Urobilinogen, UA: 1 mg/dL (ref 0.0–1.0)
pH: 5.5 (ref 5.0–8.0)
pH: 6

## 2011-09-01 LAB — HIV ANTIBODY (ROUTINE TESTING W REFLEX): HIV: NONREACTIVE

## 2011-09-01 LAB — COMPREHENSIVE METABOLIC PANEL
ALT: 80 U/L — ABNORMAL HIGH (ref 0–53)
ALT: 128 — ABNORMAL HIGH
AST: 28 U/L (ref 0–37)
AST: 116 — ABNORMAL HIGH
AST: 36
Albumin: 2 g/dL — ABNORMAL LOW (ref 3.5–5.2)
Albumin: 2.2 g/dL — ABNORMAL LOW (ref 3.5–5.2)
Albumin: 2.2 g/dL — ABNORMAL LOW (ref 3.5–5.2)
Albumin: 2.4 g/dL — ABNORMAL LOW (ref 3.5–5.2)
Albumin: 2.7 g/dL — ABNORMAL LOW (ref 3.5–5.2)
Albumin: 3.7
Alkaline Phosphatase: 124 U/L — ABNORMAL HIGH (ref 39–117)
Alkaline Phosphatase: 207 U/L — ABNORMAL HIGH (ref 39–117)
Alkaline Phosphatase: 129 — ABNORMAL HIGH
BUN: 10 mg/dL (ref 6–23)
BUN: 3 mg/dL — ABNORMAL LOW (ref 6–23)
BUN: 3 mg/dL — ABNORMAL LOW (ref 6–23)
BUN: 3 mg/dL — ABNORMAL LOW (ref 6–23)
BUN: 8 mg/dL (ref 6–23)
BUN: 7
CO2: 24 mEq/L (ref 19–32)
CO2: 26 mEq/L (ref 19–32)
CO2: 26
Calcium: 6.9 mg/dL — ABNORMAL LOW (ref 8.4–10.5)
Calcium: 8 mg/dL — ABNORMAL LOW (ref 8.4–10.5)
Calcium: 8.2 — ABNORMAL LOW
Chloride: 101 mEq/L (ref 96–112)
Chloride: 102 mEq/L (ref 96–112)
Chloride: 106 mEq/L (ref 96–112)
Chloride: 104
Chloride: 96
Creatinine, Ser: 0.51 mg/dL (ref 0.4–1.5)
Creatinine, Ser: 0.59 mg/dL (ref 0.4–1.5)
Creatinine, Ser: 0.6 mg/dL (ref 0.4–1.5)
Creatinine, Ser: 0.65 mg/dL (ref 0.4–1.5)
Creatinine, Ser: 0.69
GFR calc Af Amer: >60 mL/min (ref >60)
GFR calc Af Amer: >60 mL/min (ref >60)
GFR calc Af Amer: >60 mL/min (ref >60)
GFR calc Af Amer: >60
GFR calc Af Amer: >60
GFR calc non Af Amer: >60 mL/min (ref >60)
Glucose, Bld: 120 mg/dL — ABNORMAL HIGH (ref 70–99)
Glucose, Bld: 80 mg/dL (ref 70–99)
Glucose, Bld: 108 — ABNORMAL HIGH
Potassium: 3.2 mEq/L — ABNORMAL LOW (ref 3.5–5.1)
Potassium: 3.6 mEq/L (ref 3.5–5.1)
Potassium: 4 mEq/L (ref 3.5–5.1)
Sodium: 134 mEq/L — ABNORMAL LOW (ref 135–145)
Sodium: 132 — ABNORMAL LOW
Total Bilirubin: 0.7 mg/dL (ref 0.3–1.2)
Total Bilirubin: 0.9 mg/dL (ref 0.3–1.2)
Total Bilirubin: 1.1 mg/dL (ref 0.3–1.2)
Total Bilirubin: 1.6 mg/dL — ABNORMAL HIGH (ref 0.3–1.2)
Total Bilirubin: 1.4 — ABNORMAL HIGH
Total Bilirubin: 1.7 — ABNORMAL HIGH
Total Protein: 5.2 g/dL — ABNORMAL LOW (ref 6.0–8.3)
Total Protein: 5.8 g/dL — ABNORMAL LOW (ref 6.0–8.3)
Total Protein: 6.5 g/dL (ref 6.0–8.3)
Total Protein: 6.9 g/dL (ref 6.0–8.3)
Total Protein: 6.2

## 2011-09-01 LAB — MAGNESIUM
Magnesium: 2.4 mg/dL (ref 1.5–2.5)
Magnesium: 2.4 mg/dL (ref 1.5–2.5)
Magnesium: 2.4 mg/dL (ref 1.5–2.5)
Magnesium: 1.9
Magnesium: 2.1

## 2011-09-01 LAB — GLUCOSE, CAPILLARY
Glucose-Capillary: 130 mg/dL — ABNORMAL HIGH (ref 70–99)
Glucose-Capillary: 130 mg/dL — ABNORMAL HIGH (ref 70–99)
Glucose-Capillary: 131 mg/dL — ABNORMAL HIGH (ref 70–99)
Glucose-Capillary: 135 mg/dL — ABNORMAL HIGH (ref 70–99)
Glucose-Capillary: 137 mg/dL — ABNORMAL HIGH (ref 70–99)
Glucose-Capillary: 139 mg/dL — ABNORMAL HIGH (ref 70–99)
Glucose-Capillary: 144 mg/dL — ABNORMAL HIGH (ref 70–99)
Glucose-Capillary: 150 mg/dL — ABNORMAL HIGH (ref 70–99)
Glucose-Capillary: 150 mg/dL — ABNORMAL HIGH (ref 70–99)
Glucose-Capillary: 151 mg/dL — ABNORMAL HIGH (ref 70–99)
Glucose-Capillary: 151 mg/dL — ABNORMAL HIGH (ref 70–99)
Glucose-Capillary: 153 mg/dL — ABNORMAL HIGH (ref 70–99)
Glucose-Capillary: 207 mg/dL — ABNORMAL HIGH (ref 70–99)
Glucose-Capillary: 224 mg/dL — ABNORMAL HIGH (ref 70–99)

## 2011-09-01 LAB — TYPE AND SCREEN
ABO/RH(D): A POS
Antibody Screen: NEGATIVE

## 2011-09-01 LAB — HEPATIC FUNCTION PANEL
ALT: 325 U/L — ABNORMAL HIGH (ref 0–53)
ALT: 274 — ABNORMAL HIGH
AST: 213 U/L — ABNORMAL HIGH (ref 0–37)
AST: 66 — ABNORMAL HIGH
Albumin: 3.7 g/dL (ref 3.5–5.2)
Albumin: 3.1 — ABNORMAL LOW
Albumin: 3.4 — ABNORMAL LOW
Alkaline Phosphatase: 177 — ABNORMAL HIGH
Bilirubin, Direct: 0.4 — ABNORMAL HIGH
Indirect Bilirubin: 1.2 — ABNORMAL HIGH
Total Bilirubin: 1.6 — ABNORMAL HIGH

## 2011-09-01 LAB — TSH: TSH: 1.337

## 2011-09-01 LAB — CULTURE, BLOOD (ROUTINE X 2)
Culture: NO GROWTH
Culture: NO GROWTH

## 2011-09-01 LAB — RAPID URINE DRUG SCREEN, HOSP PERFORMED
Amphetamines: NOT DETECTED
Barbiturates: NOT DETECTED
Benzodiazepines: NOT DETECTED
Cocaine: NOT DETECTED
Opiates: POSITIVE — AB
Tetrahydrocannabinol: NOT DETECTED

## 2011-09-01 LAB — POCT I-STAT, CHEM 8
BUN: 11
Creatinine, Ser: 0.8
Glucose, Bld: 129 — ABNORMAL HIGH
HCT: 47 % (ref 39.0–52.0)
Hemoglobin: 16 g/dL (ref 13.0–17.0)
Hemoglobin: 16.3
Potassium: 3.1 mEq/L — ABNORMAL LOW (ref 3.5–5.1)
Sodium: 139 mEq/L (ref 135–145)
TCO2: 26 mmol/L (ref 0–100)
TCO2: 25

## 2011-09-01 LAB — PROTIME-INR
INR: 1.3 (ref 0.00–1.49)
Prothrombin Time: 14.1 seconds (ref 11.6–15.2)
Prothrombin Time: 16.8 seconds — ABNORMAL HIGH (ref 11.6–15.2)
Prothrombin Time: 13.2

## 2011-09-01 LAB — LIPASE, BLOOD
Lipase: 35 U/L (ref 11–59)
Lipase: 497 — ABNORMAL HIGH

## 2011-09-01 LAB — MITOCHONDRIAL ANTIBODIES: Mitochondrial M2 Ab, IgG: <20.1 Units (ref <20.1)

## 2011-09-01 LAB — CLOSTRIDIUM DIFFICILE EIA

## 2011-09-01 LAB — HEPATITIS PANEL, ACUTE
HCV Ab: NEGATIVE
Hep A IgM: NEGATIVE
Hepatitis B Surface Ag: NEGATIVE

## 2011-09-01 LAB — CHOLESTEROL, TOTAL: Cholesterol: 119 mg/dL (ref 0–200)

## 2011-09-01 LAB — ETHANOL: Alcohol, Ethyl (B): <5

## 2011-09-01 LAB — PREALBUMIN: Prealbumin: 4.4 mg/dL — ABNORMAL LOW (ref 18.0–45.0)

## 2011-09-01 LAB — APTT: aPTT: 30 seconds (ref 24–37)

## 2011-09-04 LAB — COMPREHENSIVE METABOLIC PANEL
ALT: 51 U/L (ref 0–53)
ALT: 332 U/L — ABNORMAL HIGH (ref 0–53)
ALT: 39 U/L (ref 0–53)
AST: 34 U/L (ref 0–37)
AST: 171 U/L — ABNORMAL HIGH (ref 0–37)
AST: 20 U/L (ref 0–37)
Albumin: 2.5 g/dL — ABNORMAL LOW (ref 3.5–5.2)
Albumin: 3.9 g/dL (ref 3.5–5.2)
Alkaline Phosphatase: 291 U/L — ABNORMAL HIGH (ref 39–117)
BUN: 14 mg/dL (ref 6–23)
BUN: 4 mg/dL — ABNORMAL LOW (ref 6–23)
CO2: 26 mEq/L (ref 19–32)
CO2: 27 mEq/L (ref 19–32)
Calcium: 8.8 mg/dL (ref 8.4–10.5)
Calcium: 8.9 mg/dL (ref 8.4–10.5)
Chloride: 104 mEq/L (ref 96–112)
Chloride: 106 mEq/L (ref 96–112)
Chloride: 99 mEq/L (ref 96–112)
Creatinine, Ser: 0.62 mg/dL (ref 0.4–1.5)
Creatinine, Ser: 0.66 mg/dL (ref 0.4–1.5)
Creatinine, Ser: 0.58 mg/dL (ref 0.4–1.5)
Creatinine, Ser: 0.67 mg/dL (ref 0.4–1.5)
GFR calc Af Amer: >60 mL/min (ref >60)
GFR calc Af Amer: >60 mL/min (ref >60)
GFR calc Af Amer: >60 mL/min (ref >60)
GFR calc non Af Amer: >60 mL/min (ref >60)
GFR calc non Af Amer: >60 mL/min (ref >60)
Glucose, Bld: 122 mg/dL — ABNORMAL HIGH (ref 70–99)
Glucose, Bld: 93 mg/dL (ref 70–99)
Glucose, Bld: 108 mg/dL — ABNORMAL HIGH (ref 70–99)
Potassium: 3.8 mEq/L (ref 3.5–5.1)
Potassium: 3 mEq/L — ABNORMAL LOW (ref 3.5–5.1)
Sodium: 135 mEq/L (ref 135–145)
Sodium: 137 mEq/L (ref 135–145)
Sodium: 140 mEq/L (ref 135–145)
Total Bilirubin: 0.9 mg/dL (ref 0.3–1.2)
Total Bilirubin: 1.5 mg/dL — ABNORMAL HIGH (ref 0.3–1.2)
Total Protein: 6.8 g/dL (ref 6.0–8.3)
Total Protein: 7 g/dL (ref 6.0–8.3)
Total Protein: 7.4 g/dL (ref 6.0–8.3)

## 2011-09-04 LAB — CBC
HCT: 33.1 % — ABNORMAL LOW (ref 39.0–52.0)
HCT: 35.7 % — ABNORMAL LOW (ref 39.0–52.0)
HCT: 31.3 % — ABNORMAL LOW (ref 39.0–52.0)
HCT: 32 % — ABNORMAL LOW (ref 39.0–52.0)
Hemoglobin: 11.3 g/dL — ABNORMAL LOW (ref 13.0–17.0)
Hemoglobin: 12 g/dL — ABNORMAL LOW (ref 13.0–17.0)
Hemoglobin: 12.4 g/dL — ABNORMAL LOW (ref 13.0–17.0)
MCHC: 34 g/dL (ref 30.0–36.0)
MCHC: 34.8 g/dL (ref 30.0–36.0)
MCHC: 35.2 g/dL (ref 30.0–36.0)
MCHC: 34.2 g/dL (ref 30.0–36.0)
MCHC: 34.7 g/dL (ref 30.0–36.0)
MCV: 88.7 fL (ref 78.0–100.0)
MCV: 91.4 fL (ref 78.0–100.0)
MCV: 89.2 fL (ref 78.0–100.0)
MCV: 89.9 fL (ref 78.0–100.0)
MCV: 89.9 fL (ref 78.0–100.0)
MCV: 90.2 fL (ref 78.0–100.0)
Platelets: 386 10*3/uL (ref 150–400)
Platelets: 390 10*3/uL (ref 150–400)
Platelets: 130 10*3/uL — ABNORMAL LOW (ref 150–400)
Platelets: 157 10*3/uL (ref 150–400)
Platelets: 201 10*3/uL (ref 150–400)
Platelets: 267 10*3/uL (ref 150–400)
Platelets: 286 10*3/uL (ref 150–400)
RBC: 3.88 MIL/uL — ABNORMAL LOW (ref 4.22–5.81)
RBC: 3.81 MIL/uL — ABNORMAL LOW (ref 4.22–5.81)
RBC: 4.02 MIL/uL — ABNORMAL LOW (ref 4.22–5.81)
RDW: 13.4 % (ref 11.5–15.5)
RDW: 13.9 % (ref 11.5–15.5)
RDW: 14 % (ref 11.5–15.5)
RDW: 14.2 % (ref 11.5–15.5)
RDW: 15.3 % (ref 11.5–15.5)
WBC: 8.1 10*3/uL (ref 4.0–10.5)
WBC: 3.2 10*3/uL — ABNORMAL LOW (ref 4.0–10.5)
WBC: 4.8 10*3/uL (ref 4.0–10.5)
WBC: 5.8 10*3/uL (ref 4.0–10.5)
WBC: 9.4 10*3/uL (ref 4.0–10.5)

## 2011-09-04 LAB — GLUCOSE, CAPILLARY
Glucose-Capillary: 112 mg/dL — ABNORMAL HIGH (ref 70–99)
Glucose-Capillary: 112 mg/dL — ABNORMAL HIGH (ref 70–99)
Glucose-Capillary: 115 mg/dL — ABNORMAL HIGH (ref 70–99)
Glucose-Capillary: 120 mg/dL — ABNORMAL HIGH (ref 70–99)
Glucose-Capillary: 121 mg/dL — ABNORMAL HIGH (ref 70–99)
Glucose-Capillary: 124 mg/dL — ABNORMAL HIGH (ref 70–99)
Glucose-Capillary: 130 mg/dL — ABNORMAL HIGH (ref 70–99)
Glucose-Capillary: 141 mg/dL — ABNORMAL HIGH (ref 70–99)
Glucose-Capillary: 144 mg/dL — ABNORMAL HIGH (ref 70–99)
Glucose-Capillary: 152 mg/dL — ABNORMAL HIGH (ref 70–99)
Glucose-Capillary: 156 mg/dL — ABNORMAL HIGH (ref 70–99)
Glucose-Capillary: 162 mg/dL — ABNORMAL HIGH (ref 70–99)
Glucose-Capillary: 178 mg/dL — ABNORMAL HIGH (ref 70–99)
Glucose-Capillary: 181 mg/dL — ABNORMAL HIGH (ref 70–99)
Glucose-Capillary: 82 mg/dL (ref 70–99)
Glucose-Capillary: 98 mg/dL (ref 70–99)
Glucose-Capillary: 123 mg/dL — ABNORMAL HIGH (ref 70–99)

## 2011-09-04 LAB — URINALYSIS, ROUTINE W REFLEX MICROSCOPIC
Bilirubin Urine: NEGATIVE
Bilirubin Urine: NEGATIVE
Glucose, UA: NEGATIVE mg/dL
Hgb urine dipstick: NEGATIVE
Hgb urine dipstick: NEGATIVE
Protein, ur: NEGATIVE mg/dL
Specific Gravity, Urine: 1.012 (ref 1.005–1.030)
Urobilinogen, UA: 0.2 mg/dL (ref 0.0–1.0)
pH: 6.5 (ref 5.0–8.0)

## 2011-09-04 LAB — BASIC METABOLIC PANEL
BUN: 11 mg/dL (ref 6–23)
BUN: 1 mg/dL — ABNORMAL LOW (ref 6–23)
BUN: 2 mg/dL — ABNORMAL LOW (ref 6–23)
BUN: 2 mg/dL — ABNORMAL LOW (ref 6–23)
BUN: <1 mg/dL — ABNORMAL LOW (ref 6–23)
CO2: 22 mEq/L (ref 19–32)
CO2: 27 mEq/L (ref 19–32)
CO2: 29 mEq/L (ref 19–32)
Calcium: 8.9 mg/dL (ref 8.4–10.5)
Calcium: 8.4 mg/dL (ref 8.4–10.5)
Calcium: 9.1 mg/dL (ref 8.4–10.5)
Chloride: 105 mEq/L (ref 96–112)
Chloride: 102 mEq/L (ref 96–112)
Chloride: 98 mEq/L (ref 96–112)
Chloride: 99 mEq/L (ref 96–112)
Chloride: 99 mEq/L (ref 96–112)
Creatinine, Ser: 0.64 mg/dL (ref 0.4–1.5)
Creatinine, Ser: 0.46 mg/dL (ref 0.4–1.5)
Creatinine, Ser: 0.48 mg/dL (ref 0.4–1.5)
Creatinine, Ser: 0.53 mg/dL (ref 0.4–1.5)
GFR calc Af Amer: >60 mL/min (ref >60)
GFR calc Af Amer: >60 mL/min (ref >60)
GFR calc Af Amer: >60 mL/min (ref >60)
GFR calc non Af Amer: >60 mL/min (ref >60)
GFR calc non Af Amer: >60 mL/min (ref >60)
GFR calc non Af Amer: >60 mL/min (ref >60)
GFR calc non Af Amer: >60 mL/min (ref >60)
Glucose, Bld: 182 mg/dL — ABNORMAL HIGH (ref 70–99)
Glucose, Bld: 114 mg/dL — ABNORMAL HIGH (ref 70–99)
Glucose, Bld: 118 mg/dL — ABNORMAL HIGH (ref 70–99)
Glucose, Bld: 90 mg/dL (ref 70–99)
Potassium: 3.2 mEq/L — ABNORMAL LOW (ref 3.5–5.1)
Potassium: 3.9 mEq/L (ref 3.5–5.1)
Potassium: 4 mEq/L (ref 3.5–5.1)
Sodium: 135 mEq/L (ref 135–145)
Sodium: 132 mEq/L — ABNORMAL LOW (ref 135–145)
Sodium: 139 mEq/L (ref 135–145)

## 2011-09-04 LAB — CULTURE, BLOOD (ROUTINE X 2)
Culture: NO GROWTH
Culture: NO GROWTH

## 2011-09-04 LAB — PHOSPHORUS: Phosphorus: 2.7 mg/dL (ref 2.3–4.6)

## 2011-09-04 LAB — HEPATIC FUNCTION PANEL
ALT: 102 U/L — ABNORMAL HIGH (ref 0–53)
AST: 13 U/L (ref 0–37)
AST: 51 U/L — ABNORMAL HIGH (ref 0–37)
Albumin: 2.7 g/dL — ABNORMAL LOW (ref 3.5–5.2)
Alkaline Phosphatase: 148 U/L — ABNORMAL HIGH (ref 39–117)
Bilirubin, Direct: 0.2 mg/dL (ref 0.0–0.3)
Bilirubin, Direct: 0.3 mg/dL (ref 0.0–0.3)
Bilirubin, Direct: 0.4 mg/dL — ABNORMAL HIGH (ref 0.0–0.3)
Bilirubin, Direct: 0.4 mg/dL — ABNORMAL HIGH (ref 0.0–0.3)
Indirect Bilirubin: 0.9 mg/dL (ref 0.3–0.9)
Indirect Bilirubin: 1.4 mg/dL — ABNORMAL HIGH (ref 0.3–0.9)
Total Bilirubin: 1.2 mg/dL (ref 0.3–1.2)
Total Bilirubin: 1.8 mg/dL — ABNORMAL HIGH (ref 0.3–1.2)

## 2011-09-04 LAB — LIPID PANEL
Cholesterol: 139 mg/dL (ref 0–200)
HDL: 32 mg/dL — ABNORMAL LOW (ref >39)
LDL Cholesterol: 90 mg/dL (ref 0–99)
Total CHOL/HDL Ratio: 4.3 RATIO

## 2011-09-04 LAB — DIFFERENTIAL
Basophils Absolute: 0 10*3/uL (ref 0.0–0.1)
Basophils Relative: 0 % (ref 0–1)
Eosinophils Absolute: 0 10*3/uL (ref 0.0–0.7)
Eosinophils Relative: 0 % (ref 0–5)
Eosinophils Relative: 1 % (ref 0–5)
Monocytes Absolute: 0.6 10*3/uL (ref 0.1–1.0)
Monocytes Relative: 6 % (ref 3–12)
Neutro Abs: 6.3 10*3/uL (ref 1.7–7.7)
Neutrophils Relative %: 86 % — ABNORMAL HIGH (ref 43–77)

## 2011-09-04 LAB — PROTIME-INR: INR: 1 (ref 0.00–1.49)

## 2011-09-04 LAB — LIPASE, BLOOD
Lipase: 41 U/L (ref 11–59)
Lipase: 57 U/L (ref 11–59)

## 2011-09-04 LAB — CLOSTRIDIUM DIFFICILE EIA

## 2011-09-04 LAB — C-REACTIVE PROTEIN
CRP: 10.5 mg/dL — ABNORMAL HIGH (ref <0.6)
CRP: 28.9 mg/dL — ABNORMAL HIGH (ref <0.6)

## 2011-09-04 LAB — APTT: aPTT: 31 seconds (ref 24–37)

## 2011-09-04 LAB — MAGNESIUM: Magnesium: 2.4 mg/dL (ref 1.5–2.5)

## 2011-09-15 ENCOUNTER — Emergency Department (HOSPITAL_COMMUNITY)
Admission: EM | Admit: 2011-09-15 | Discharge: 2011-09-15 | Disposition: A | Discharge status: Home / Self Care | Payer: BC Managed Care – PPO | Attending: Emergency Medicine | Admitting: Emergency Medicine

## 2011-09-15 ENCOUNTER — Emergency Department (HOSPITAL_COMMUNITY): Payer: BC Managed Care – PPO

## 2011-09-15 DIAGNOSIS — R6883 Chills (without fever): Secondary | ICD-10-CM | POA: Insufficient documentation

## 2011-09-15 DIAGNOSIS — R05 Cough: Secondary | ICD-10-CM | POA: Insufficient documentation

## 2011-09-15 DIAGNOSIS — L298 Other pruritus: Secondary | ICD-10-CM | POA: Insufficient documentation

## 2011-09-15 DIAGNOSIS — I10 Essential (primary) hypertension: Secondary | ICD-10-CM | POA: Insufficient documentation

## 2011-09-15 DIAGNOSIS — R1110 Vomiting, unspecified: Secondary | ICD-10-CM | POA: Insufficient documentation

## 2011-09-15 DIAGNOSIS — R059 Cough, unspecified: Secondary | ICD-10-CM | POA: Insufficient documentation

## 2011-09-15 DIAGNOSIS — M7989 Other specified soft tissue disorders: Secondary | ICD-10-CM | POA: Insufficient documentation

## 2011-09-15 DIAGNOSIS — J40 Bronchitis, not specified as acute or chronic: Secondary | ICD-10-CM | POA: Insufficient documentation

## 2011-09-15 DIAGNOSIS — L2989 Other pruritus: Secondary | ICD-10-CM | POA: Insufficient documentation

## 2011-09-15 LAB — BASIC METABOLIC PANEL
BUN: 13 mg/dL (ref 6–23)
CO2: 28 mEq/L (ref 19–32)
Chloride: 103 mEq/L (ref 96–112)
Creatinine, Ser: 0.8 mg/dL (ref 0.50–1.35)
GFR calc Af Amer: >90 mL/min (ref >90)
Potassium: 3.8 mEq/L (ref 3.5–5.1)

## 2011-09-15 LAB — DIFFERENTIAL
Basophils Absolute: 0 10*3/uL (ref 0.0–0.1)
Lymphocytes Relative: 44 % (ref 12–46)
Lymphs Abs: 1.7 10*3/uL (ref 0.7–4.0)
Neutro Abs: 1.5 10*3/uL — ABNORMAL LOW (ref 1.7–7.7)
Neutrophils Relative %: 39 % — ABNORMAL LOW (ref 43–77)

## 2011-09-15 LAB — CBC
HCT: 44.3 % (ref 39.0–52.0)
Hemoglobin: 16.3 g/dL (ref 13.0–17.0)
MCV: 90.4 fL (ref 78.0–100.0)
RBC: 4.9 MIL/uL (ref 4.22–5.81)
RDW: 13 % (ref 11.5–15.5)
WBC: 3.8 10*3/uL — ABNORMAL LOW (ref 4.0–10.5)

## 2011-09-28 ENCOUNTER — Emergency Department (HOSPITAL_COMMUNITY)
Admission: EM | Admit: 2011-09-28 | Discharge: 2011-09-28 | Disposition: A | Discharge status: Home / Self Care | Payer: No Typology Code available for payment source | Attending: Emergency Medicine | Admitting: Emergency Medicine

## 2011-09-28 ENCOUNTER — Emergency Department (HOSPITAL_COMMUNITY): Payer: No Typology Code available for payment source

## 2011-09-28 DIAGNOSIS — M546 Pain in thoracic spine: Secondary | ICD-10-CM | POA: Insufficient documentation

## 2011-09-28 DIAGNOSIS — M542 Cervicalgia: Secondary | ICD-10-CM | POA: Insufficient documentation

## 2011-09-28 DIAGNOSIS — I10 Essential (primary) hypertension: Secondary | ICD-10-CM | POA: Insufficient documentation

## 2013-05-27 ENCOUNTER — Encounter (HOSPITAL_COMMUNITY): Payer: Self-pay | Admitting: Emergency Medicine

## 2013-05-27 ENCOUNTER — Emergency Department (HOSPITAL_COMMUNITY): Payer: Self-pay

## 2013-05-27 ENCOUNTER — Emergency Department (HOSPITAL_COMMUNITY)
Admission: EM | Admit: 2013-05-27 | Discharge: 2013-05-27 | Disposition: A | Discharge status: Home / Self Care | Payer: Self-pay | Attending: Emergency Medicine | Admitting: Emergency Medicine

## 2013-05-27 DIAGNOSIS — Z79899 Other long term (current) drug therapy: Secondary | ICD-10-CM | POA: Insufficient documentation

## 2013-05-27 DIAGNOSIS — X503XXA Overexertion from repetitive movements, initial encounter: Secondary | ICD-10-CM | POA: Insufficient documentation

## 2013-05-27 DIAGNOSIS — S8990XA Unspecified injury of unspecified lower leg, initial encounter: Secondary | ICD-10-CM | POA: Insufficient documentation

## 2013-05-27 DIAGNOSIS — F17200 Nicotine dependence, unspecified, uncomplicated: Secondary | ICD-10-CM | POA: Insufficient documentation

## 2013-05-27 DIAGNOSIS — M25469 Effusion, unspecified knee: Secondary | ICD-10-CM | POA: Insufficient documentation

## 2013-05-27 DIAGNOSIS — M25462 Effusion, left knee: Secondary | ICD-10-CM

## 2013-05-27 DIAGNOSIS — S99919A Unspecified injury of unspecified ankle, initial encounter: Secondary | ICD-10-CM | POA: Insufficient documentation

## 2013-05-27 DIAGNOSIS — S8992XA Unspecified injury of left lower leg, initial encounter: Secondary | ICD-10-CM

## 2013-05-27 DIAGNOSIS — Y929 Unspecified place or not applicable: Secondary | ICD-10-CM | POA: Insufficient documentation

## 2013-05-27 DIAGNOSIS — Y9301 Activity, walking, marching and hiking: Secondary | ICD-10-CM | POA: Insufficient documentation

## 2013-05-27 MED ORDER — NAPROXEN 500 MG PO TABS: 500.0000 mg | ORAL_TABLET | Freq: Two times a day (BID) | ORAL | Status: DC

## 2013-05-27 MED ORDER — HYDROCODONE-ACETAMINOPHEN 5-325 MG PO TABS: ORAL_TABLET | ORAL | Status: DC

## 2013-05-27 MED ORDER — HYDROCODONE-ACETAMINOPHEN 5-325 MG PO TABS
1.0000 | ORAL_TABLET | Freq: Once | ORAL | Status: AC
  Administered 2013-05-27: 1 via ORAL
  Filled 2013-05-27: qty 1

## 2013-05-27 NOTE — ED Provider Notes (Signed)
Medical screening examination/treatment/procedure(s) were performed by non-physician practitioner and as supervising physician I was immediately available for consultation/collaboration. Brekyn Huntoon, MD, FACEP   Albirtha Grinage L Walden Statz, MD 05/27/13 1526 

## 2013-05-27 NOTE — ED Provider Notes (Signed)
History    CSN: [id number] Arrival date & time 05/27/13  1314  None    Chief Complaint  Patient presents with  . Knee Pain   (Consider location/radiation/quality/duration/timing/severity/associated sxs/prior Treatment) HPI Comments: Patient presents with complaint of left knee pain swelling that began 5 days ago when he was walking up a set of stairs. Patient states that he missed a step and twisted his left knee. Patient states that the swelling has been worse over the past 2 days and has felt warm. Pain radiates behind his knee and into his calf. He is ambulatory but with pain. Pain is worse at night. No treatments prior to arrival. Walking and palpation makes the pain worse. Rest makes it better.  The history is provided by the patient.   History reviewed. No pertinent past medical history. Past Surgical History  Procedure Laterality Date  . Cholecystectomy     No family history on file. History  Substance Use Topics  . Smoking status: Current Every Day Smoker -- 0.50 packs/day    Types: Cigarettes  . Smokeless tobacco: Not on file  . Alcohol Use: Yes     Comment: occasional     Review of Systems  Constitutional: Negative for activity change.  HENT: Negative for neck pain.   Musculoskeletal: Positive for joint swelling, arthralgias and gait problem. Negative for back pain.  Skin: Negative for wound.  Neurological: Negative for weakness and numbness.    Allergies  Review of patient's allergies indicates no known allergies.  Home Medications   Current Outpatient Rx  Name  Route  Sig  Dispense  Refill  . HYDROcodone-acetaminophen (NORCO/VICODIN) 5-325 MG per tablet      Take 1-2 tablets every 6 hours as needed for severe pain   12 tablet   0   . naproxen (NAPROSYN) 500 MG tablet   Oral   Take 1 tablet (500 mg total) by mouth 2 (two) times daily.   20 tablet   0    BP 118/82  Pulse 98  Temp(Src) 97.6 F (36.4 C) (Oral)  Resp 20  SpO2 98% Physical Exam   Nursing note and vitals reviewed. Constitutional: He appears well-developed and well-nourished.  HENT:  Head: Normocephalic and atraumatic.  Eyes: Conjunctivae are normal.  Neck: Normal range of motion. Neck supple.  Cardiovascular: Normal pulses.   Musculoskeletal: He exhibits tenderness. He exhibits no edema.       Right knee: He exhibits swelling and effusion. He exhibits normal range of motion. Tenderness found. Medial joint line tenderness noted.       Legs: Neurological: He is alert. No sensory deficit.  Motor, sensation, and vascular distal to the injury is fully intact.   Skin: Skin is warm and dry.  Psychiatric: He has a normal mood and affect.    ED Course  Procedures (including critical care time) Labs Reviewed - No data to display Dg Knee Complete 4 Views Left  05/27/2013   *RADIOLOGY REPORT*  Clinical Data: Twisting injury.  LEFT KNEE - COMPLETE 4+ VIEW  Comparison: None.  Findings: There is no evidence of fractures or bone lesions.  There are no soft tissue abnormalities.  The joint spaces are normally maintained.  There may be a small joint effusion.  IMPRESSION: Question of small joint effusion.  No fractures or additional abnormalities.   Original Report Authenticated By: Sander Radon, M.D.   1. Knee injury, left, initial encounter   2. Knee effusion, left     1:53 PM Patient  seen and examined. Work-up initiated.   Vital signs reviewed and are as follows: Filed Vitals:   05/27/13 1321  BP: 118/82  Pulse: 98  Temp: 97.6 F (36.4 C)  Resp: 20   3:09 PM Patient informed of x-ray results. Patient provided with crutches and Ace wrap. Urged followup with orthopedic referral.  Patient counseled on use of narcotic pain medications. Counseled not to combine these medications with others containing tylenol. Urged not to drink alcohol, drive, or perform any other activities that requires focus while taking these medications. The patient verbalizes understanding and  agrees with the plan.    MDM  Patient with knee injury and likely effusion. X-ray negative. Suspect internal derangement. Orthopedic followup given for further evaluation. In the interim, rice protocol and pain medication indicated for symptomatic relief. Lower extremity is neurovascularly intact.  Renne Crigler, PA-C 05/27/13 1511

## 2013-05-27 NOTE — ED Notes (Signed)
Pt c/o left knee pain that started about six days ago while "picking up heavy stuff" and felt a twist in his knee. Pt denies falling and stated that his knee and ankle feel hot that is worse at night. Pt unable to sleep.

## 2013-11-16 ENCOUNTER — Encounter (HOSPITAL_COMMUNITY): Payer: Self-pay | Admitting: Emergency Medicine

## 2013-11-16 ENCOUNTER — Emergency Department (INDEPENDENT_AMBULATORY_CARE_PROVIDER_SITE_OTHER)
Admission: EM | Admit: 2013-11-16 | Discharge: 2013-11-16 | Disposition: A | Discharge status: Home / Self Care | Payer: Self-pay | Source: Home / Self Care

## 2013-11-16 DIAGNOSIS — IMO0002 Reserved for concepts with insufficient information to code with codable children: Secondary | ICD-10-CM

## 2013-11-16 DIAGNOSIS — S76219A Strain of adductor muscle, fascia and tendon of unspecified thigh, initial encounter: Secondary | ICD-10-CM

## 2013-11-16 MED ORDER — DICLOFENAC POTASSIUM 50 MG PO TABS: 50.0000 mg | ORAL_TABLET | Freq: Three times a day (TID) | ORAL | Status: DC

## 2013-11-16 MED ORDER — HYDROCODONE-ACETAMINOPHEN 5-325 MG PO TABS: 1.0000 | ORAL_TABLET | Freq: Four times a day (QID) | ORAL | Status: DC | PRN

## 2013-11-16 NOTE — ED Notes (Addendum)
Pt c/o constant right leg pain that started four weeks ago. Pain is 10/10. Denies any injury, but states that he plays soccer. Stated that sitting and then standing, as well as during the night makes it worse. Stated he has taken [facility] powder with no relief.... Ambulated well to exam room w/NAD.[name], SMA

## 2013-11-16 NOTE — ED Provider Notes (Signed)
CSN: [id number]     Arrival date & time 11/16/13  4098 History   First MD Initiated Contact with Patient 11/16/13 1118     Chief Complaint  Patient presents with  . Leg Pain   (Consider location/radiation/quality/duration/timing/severity/associated sxs/prior Treatment) HPI Comments: 32 year old male complaining of pain in the right inguina for one week. It is worse upon flexion of the thighs forward as well as straight leg raises. After lying supine for a while been trying to get up the pain is worse. Pain is located to the right inguina. Pain radiates along the anterior thigh in past the knee. Denies any known trauma or injurious event.   History reviewed. No pertinent past medical history. Past Surgical History  Procedure Laterality Date  . Cholecystectomy     No family history on file. History  Substance Use Topics  . Smoking status: Current Every Day Smoker -- 0.50 packs/day    Types: Cigarettes  . Smokeless tobacco: Not on file  . Alcohol Use: Yes     Comment: occasional     Review of Systems  Constitutional: Negative.   Respiratory: Negative.   Gastrointestinal: Negative.   Genitourinary: Negative.   Musculoskeletal: Positive for myalgias.       As per HPI  Skin: Negative.   Neurological: Negative for dizziness, weakness, numbness and headaches.    Allergies  Review of patient's allergies indicates no known allergies.  Home Medications   Current Outpatient Rx  Name  Route  Sig  Dispense  Refill  . diclofenac (CATAFLAM) 50 MG tablet   Oral   Take 1 tablet (50 mg total) by mouth 3 (three) times daily.   21 tablet   0   . HYDROcodone-acetaminophen (NORCO) 5-325 MG per tablet   Oral   Take 1 tablet by mouth every 6 (six) hours as needed for moderate pain.   15 tablet   0    BP 130/92  Pulse 94  Temp(Src) 96.9 F (36.1 C) (Oral)  Resp 18  SpO2 95% Physical Exam  Nursing note and vitals reviewed. Constitutional: He is oriented to person, place, and  time. He appears well-developed and well-nourished. No distress.  HENT:  Head: Normocephalic and atraumatic.  Eyes: EOM are normal.  Neck: Normal range of motion. Neck supple.  Cardiovascular: Normal rate.   Pulmonary/Chest: Effort normal. No respiratory distress.  Abdominal: Soft. He exhibits no distension. There is no tenderness.  Musculoskeletal: He exhibits tenderness. He exhibits no edema.  Pain is produced with palpation to the right inguinal musculature. Pain is reproduced with straight leg raises. The pain does not radiate. There is no regional bulging or signs of hernia. Distal neurovascular , motor/ sensory is intact. No tenderness to the thigh musculature, knee or lower leg.  Neurological: He is alert and oriented to person, place, and time. No cranial nerve deficit.  Skin: Skin is warm and dry.  Psychiatric: He has a normal mood and affect.    ED Course  Procedures (including critical care time) Labs Review Labs Reviewed - No data to display Imaging Review No results found.    MDM   1. Groin strain, initial encounter     cataflam and norco as directed Heat locally Read instrucitons for groin strain.    Hayden Rasmussen, NP 11/16/13 1140

## 2013-11-18 NOTE — ED Provider Notes (Signed)
Medical screening examination/treatment/procedure(s) were performed by a resident physician or non-physician practitioner and as the supervising physician I was immediately available for consultation/collaboration.  Ivoree Felmlee, MD    Andrus Sharp S Kyrstin Campillo, MD 11/18/13 0848 

## 2014-07-25 ENCOUNTER — Emergency Department (HOSPITAL_COMMUNITY)
Admission: EM | Admit: 2014-07-25 | Discharge: 2014-07-25 | Disposition: A | Discharge status: Home / Self Care | Payer: No Typology Code available for payment source | Attending: Emergency Medicine | Admitting: Emergency Medicine

## 2014-07-25 ENCOUNTER — Encounter (HOSPITAL_COMMUNITY): Payer: Self-pay | Admitting: Emergency Medicine

## 2014-07-25 ENCOUNTER — Emergency Department (HOSPITAL_COMMUNITY): Payer: No Typology Code available for payment source

## 2014-07-25 DIAGNOSIS — S4980XA Other specified injuries of shoulder and upper arm, unspecified arm, initial encounter: Secondary | ICD-10-CM | POA: Insufficient documentation

## 2014-07-25 DIAGNOSIS — S79929A Unspecified injury of unspecified thigh, initial encounter: Secondary | ICD-10-CM

## 2014-07-25 DIAGNOSIS — R0781 Pleurodynia: Secondary | ICD-10-CM

## 2014-07-25 DIAGNOSIS — S46909A Unspecified injury of unspecified muscle, fascia and tendon at shoulder and upper arm level, unspecified arm, initial encounter: Secondary | ICD-10-CM | POA: Insufficient documentation

## 2014-07-25 DIAGNOSIS — F17200 Nicotine dependence, unspecified, uncomplicated: Secondary | ICD-10-CM | POA: Insufficient documentation

## 2014-07-25 DIAGNOSIS — S298XXA Other specified injuries of thorax, initial encounter: Secondary | ICD-10-CM | POA: Insufficient documentation

## 2014-07-25 DIAGNOSIS — M25511 Pain in right shoulder: Secondary | ICD-10-CM

## 2014-07-25 DIAGNOSIS — Y9389 Activity, other specified: Secondary | ICD-10-CM | POA: Insufficient documentation

## 2014-07-25 DIAGNOSIS — S79919A Unspecified injury of unspecified hip, initial encounter: Secondary | ICD-10-CM | POA: Insufficient documentation

## 2014-07-25 DIAGNOSIS — Y92410 Unspecified street and highway as the place of occurrence of the external cause: Secondary | ICD-10-CM | POA: Insufficient documentation

## 2014-07-25 MED ORDER — MELOXICAM 15 MG PO TABS: 15.0000 mg | ORAL_TABLET | Freq: Every day | ORAL | Status: DC

## 2014-07-25 MED ORDER — METHOCARBAMOL 500 MG PO TABS: 500.0000 mg | ORAL_TABLET | Freq: Two times a day (BID) | ORAL | Status: DC

## 2014-07-25 MED ORDER — METHOCARBAMOL 500 MG PO TABS
500.0000 mg | ORAL_TABLET | Freq: Once | ORAL | Status: AC
  Administered 2014-07-25: 500 mg via ORAL
  Filled 2014-07-25: qty 1

## 2014-07-25 NOTE — ED Notes (Signed)
Discharge instructions reviewed with pt. Pt verbalized understanding.   

## 2014-07-25 NOTE — ED Provider Notes (Signed)
Medical screening examination/treatment/procedure(s) were performed by non-physician practitioner and as supervising physician I was immediately available for consultation/collaboration.   EKG Interpretation None        Elwin Mocha, MD 07/25/14 2250
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
## 2016-02-27 ENCOUNTER — Emergency Department (HOSPITAL_COMMUNITY)
Admission: EM | Admit: 2016-02-27 | Discharge: 2016-02-27 | Disposition: A | Discharge status: Home / Self Care | Payer: BLUE CROSS/BLUE SHIELD | Attending: Emergency Medicine | Admitting: Emergency Medicine

## 2016-02-27 ENCOUNTER — Encounter (HOSPITAL_COMMUNITY): Payer: Self-pay

## 2016-02-27 ENCOUNTER — Emergency Department (HOSPITAL_COMMUNITY): Payer: BLUE CROSS/BLUE SHIELD

## 2016-02-27 DIAGNOSIS — S8392XA Sprain of unspecified site of left knee, initial encounter: Secondary | ICD-10-CM | POA: Diagnosis not present

## 2016-02-27 DIAGNOSIS — Y9389 Activity, other specified: Secondary | ICD-10-CM | POA: Diagnosis not present

## 2016-02-27 DIAGNOSIS — Y9289 Other specified places as the place of occurrence of the external cause: Secondary | ICD-10-CM | POA: Diagnosis not present

## 2016-02-27 DIAGNOSIS — F17210 Nicotine dependence, cigarettes, uncomplicated: Secondary | ICD-10-CM | POA: Diagnosis not present

## 2016-02-27 DIAGNOSIS — S79912A Unspecified injury of left hip, initial encounter: Secondary | ICD-10-CM | POA: Insufficient documentation

## 2016-02-27 DIAGNOSIS — Y990 Civilian activity done for income or pay: Secondary | ICD-10-CM | POA: Diagnosis not present

## 2016-02-27 DIAGNOSIS — Z791 Long term (current) use of non-steroidal anti-inflammatories (NSAID): Secondary | ICD-10-CM | POA: Diagnosis not present

## 2016-02-27 DIAGNOSIS — K029 Dental caries, unspecified: Secondary | ICD-10-CM | POA: Diagnosis not present

## 2016-02-27 DIAGNOSIS — R509 Fever, unspecified: Secondary | ICD-10-CM | POA: Insufficient documentation

## 2016-02-27 DIAGNOSIS — S8992XA Unspecified injury of left lower leg, initial encounter: Secondary | ICD-10-CM | POA: Diagnosis present

## 2016-02-27 DIAGNOSIS — Z79899 Other long term (current) drug therapy: Secondary | ICD-10-CM | POA: Diagnosis not present

## 2016-02-27 DIAGNOSIS — X501XXA Overexertion from prolonged static or awkward postures, initial encounter: Secondary | ICD-10-CM | POA: Insufficient documentation

## 2016-02-27 MED ORDER — IBUPROFEN 800 MG PO TABS: 800.0000 mg | ORAL_TABLET | Freq: Three times a day (TID) | ORAL | Status: DC

## 2016-02-27 MED ORDER — TRAMADOL HCL 50 MG PO TABS: 50.0000 mg | ORAL_TABLET | Freq: Four times a day (QID) | ORAL | Status: DC | PRN

## 2016-02-27 MED ORDER — PENICILLIN V POTASSIUM 500 MG PO TABS: 500.0000 mg | ORAL_TABLET | Freq: Three times a day (TID) | ORAL | Status: DC

## 2016-02-27 NOTE — Discharge Instructions (Signed)
Dental Caries Dental caries is tooth decay. This decay can cause a hole in teeth (cavity) that can get bigger and deeper over time. HOME CARE  Brush and floss your teeth. Do this at least two times a day.  Use a fluoride toothpaste.  Use a mouth rinse if told by your dentist or doctor.  Eat less sugary and starchy foods. Drink less sugary drinks.  Avoid snacking often on sugary and starchy foods. Avoid sipping often on sugary drinks.  Keep regular checkups and cleanings with your dentist.  Use fluoride supplements if told by your dentist or doctor.  Allow fluoride to be applied to teeth if told by your dentist or doctor.   This information is not intended to replace advice given to you by your health care provider. Make sure you discuss any questions you have with your health care provider.   Document Released: 08/25/2008 Document Revised: 12/07/2014 Document Reviewed: 11/18/2012 Elsevier Interactive Patient Education 2016 [employer] Inc[employer]  Knee Sprain A knee sprain is a tear in one of the strong, fibrous tissues that connect the bones (ligaments) in your knee. The severity of the sprain depends on how much of the ligament is torn. The tear can be either partial or complete. CAUSES  Often, sprains are a result of a fall or injury. The force of the impact causes the fibers of your ligament to stretch too much. This excess tension causes the fibers of your ligament to tear. SIGNS AND SYMPTOMS  You may have some loss of motion in your knee. Other symptoms include:  Bruising.  Pain in the knee area.  Tenderness of the knee to the touch.  Swelling. DIAGNOSIS  To diagnose a knee sprain, your health care provider will physically examine your knee. Your health care provider may also suggest an X-ray exam of your knee to make sure no bones are broken. TREATMENT  If your ligament is only partially torn, treatment usually involves keeping the knee in a fixed position (immobilization) or  bracing your knee for activities that require movement for several weeks. To do this, your health care provider will apply a bandage, cast, or splint to keep your knee from moving and to support your knee during movement until it heals. For a partially torn ligament, the healing process usually takes 4-6 weeks. If your ligament is completely torn, depending on which ligament it is, you may need surgery to reconnect the ligament to the bone or reconstruct it. After surgery, a cast or splint may be applied and will need to stay on your knee for 4-6 weeks while your ligament heals. HOME CARE INSTRUCTIONS  Keep your injured knee elevated to decrease swelling.  To ease pain and swelling, apply ice to the injured area:  Put ice in a plastic bag.  Place a towel between your skin and the bag.  Leave the ice on for 20 minutes, 2-3 times a day.  Only take medicine for pain as directed by your health care provider.  Do not leave your knee unprotected until pain and stiffness go away (usually 4-6 weeks).  If you have a cast or splint, do not allow it to get wet. If you have been instructed not to remove it, cover it with a plastic bag when you shower or bathe. Do not swim.  Your health care provider may suggest exercises for you to do during your recovery to prevent or limit permanent weakness and stiffness. SEEK IMMEDIATE MEDICAL CARE IF:  Your cast or splint  becomes damaged.  Your pain becomes worse.  You have significant pain, swelling, or numbness below the cast or splint. MAKE SURE YOU:  Understand these instructions.  Will watch your condition.  Will get help right away if you are not doing well or get worse.   This information is not intended to replace advice given to you by your health care provider. Make sure you discuss any questions you have with your health care provider.   Document Released: 11/16/2005 Document Revised: 12/07/2014 Document Reviewed: 06/28/2013 Elsevier  Interactive Patient Education [employer]

## 2016-02-27 NOTE — ED Notes (Signed)
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 Patient has swelling to the medial left knee.  Patient has a hole in his left upper tooth and c/o pain.. Patient states he has an appointment next week with a dentist.

## 2016-02-27 NOTE — ED Provider Notes (Signed)
CSN: [id number]     Arrival date & time 02/27/16  1445 History  By signing my name below, I, Marisue HumbleMichelle Chaffee, attest that this documentation has been prepared under the direction and in the presence of non-physician practitioner, Fayrene HelperBowie Musab Wingard, PA-C. Electronically Signed: Marisue HumbleMichelle Chaffee, Scribe. 02/27/2016. 6:17 PM.   Chief Complaint  Patient presents with  . Fall  . Knee Pain  . Dental Pain   The history is provided by the patient. No language interpreter was used.   HPI Comments:  [name] is a 35 y.o. male who presents to the Emergency Department complaining of 8/10 left knee pain s/p fall two weeks ago, worse when sleeping and showering. Pt reports he stepped in a hole with his left foot and twisted his left knee while working. He notes pain radiates to left hip. Pt has ambulated successfully since the fall. He usually works 14 hrs day but now he can only tolerates 6hrs/day.  He took 1 BC tablet this morning with no relief. Pt denies pain in left ankle.  Pt also reports 10/10 pain to upper left teeth for the past four days. Pain is alleviated temporarily by cold water. Pt reports associated subjective fever last night. He did call his dentist and is having an appointment next week.    History reviewed. No pertinent past medical history. Past Surgical History  Procedure Laterality Date  . Cholecystectomy     Family History  Problem Relation Age of Onset  . Hypertension Mother    Social History  Substance Use Topics  . Smoking status: Current Every Day Smoker -- 0.50 packs/day    Types: Cigarettes  . Smokeless tobacco: None  . Alcohol Use: No    Review of Systems  Constitutional: Positive for fever (subjective).  HENT: Positive for dental problem.   Musculoskeletal: Positive for arthralgias (left knee, left hip).  All other systems reviewed and are negative.  Allergies  Review of patient's allergies indicates no known allergies.  Home Medications   Prior to Admission  medications   Medication Sig Start Date End Date Taking? Authorizing Provider  ibuprofen (ADVIL,MOTRIN) 800 MG tablet Take 1 tablet (800 mg total) by mouth 3 (three) times daily. 02/27/16   Fayrene HelperBowie Bernard Slayden, PA-C  meloxicam (MOBIC) 15 MG tablet Take 1 tablet (15 mg total) by mouth daily. 07/25/14   [name], PA-C  methocarbamol (ROBAXIN) 500 MG tablet Take 1 tablet (500 mg total) by mouth 2 (two) times daily. 07/25/14   [name], PA-C  penicillin v potassium (VEETID) 500 MG tablet Take 1 tablet (500 mg total) by mouth 3 (three) times daily. 02/27/16   Fayrene HelperBowie Covey Baller, PA-C  traMADol (ULTRAM) 50 MG tablet Take 1 tablet (50 mg total) by mouth every 6 (six) hours as needed. 02/27/16   Fayrene HelperBowie Jusitn Salsgiver, PA-C   BP 139/99 mmHg  Pulse 96  Temp(Src) 98 F (36.7 C) (Oral)  Resp 18  SpO2 100% Physical Exam  Constitutional: He appears well-developed and well-nourished. No distress.  HENT:  Head: Normocephalic and atraumatic.  Dental decay noted to tooth 14; TTP; no abscess noted  Eyes: Right eye exhibits no discharge. Left eye exhibits no discharge.  Pulmonary/Chest: Effort normal. No respiratory distress.  Musculoskeletal:  Left knee TTP at medial joint line as well as inferior medial knee with mild swelling noted; neg anterior and posterior drawer test; no varus or valgus laxity; no gross deformity; no pain to left ankle or left hip  Neurological: He is alert. Coordination normal.  Skin: No rash noted. He is not diaphoretic.  Psychiatric: He has a normal mood and affect. His behavior is normal.  Nursing note and vitals reviewed.   ED Course  Procedures  DIAGNOSTIC STUDIES:  Oxygen Saturation is 100% on RA, normal by my interpretation.    COORDINATION OF CARE:  6:09 PM Will prescribe antibiotic for dental pain. Will discharge with knee sleeve and pain medication. Discussed treatment plan with pt at bedside and pt agreed to plan.  Labs Review Labs Reviewed - No data to display  Imaging  Review Dg Knee Complete 4 Views Left  02/27/2016  CLINICAL DATA:  Fall in hole 2 weeks ago, swelling to medial left knee. EXAM: LEFT KNEE - COMPLETE 4+ VIEW COMPARISON:  Plain film of the left knee dated 05/27/2013. FINDINGS: Again noted is a mild medial compartment narrowing without osteophyte formation or other secondary signs of advanced degenerative osteoarthritis. Lateral compartment remains normally maintained. Osseous alignment is stable. No fracture line or displaced fracture fragment. No acute or suspicious osseous lesion. Suspect small effusion within the suprapatellar bursa but not convincing. Superficial soft tissues about the left knee are unremarkable. IMPRESSION: 1. Questionable small joint effusion within the suprapatellar bursa. 2. Otherwise, no significant findings. No osseous fracture or dislocation. Electronically Signed   By: Bary Richard M.D.   On: 02/27/2016 16:08   I have personally reviewed and evaluated these images as part of my medical decision-making.   EKG Interpretation None      MDM   Final diagnoses:  Left knee sprain, initial encounter  Pain due to dental caries   BP 139/99 mmHg  Pulse 96  Temp(Src) 98 F (36.7 C) (Oral)  Resp 18  SpO2 100%   I personally performed the services described in this documentation, which was scribed in my presence. The recorded information has been reviewed and is accurate.      Fayrene Helper, PA-C 02/27/16 1819  Zadie Rhine, MD 02/28/16 [phone]

## 2016-05-11 ENCOUNTER — Emergency Department (HOSPITAL_COMMUNITY)
Admission: EM | Admit: 2016-05-11 | Discharge: 2016-05-11 | Disposition: A | Discharge status: Home / Self Care | Payer: BLUE CROSS/BLUE SHIELD | Attending: Emergency Medicine | Admitting: Emergency Medicine

## 2016-05-11 ENCOUNTER — Encounter (HOSPITAL_COMMUNITY): Payer: Self-pay | Admitting: Vascular Surgery

## 2016-05-11 DIAGNOSIS — N342 Other urethritis: Secondary | ICD-10-CM | POA: Insufficient documentation

## 2016-05-11 DIAGNOSIS — F17210 Nicotine dependence, cigarettes, uncomplicated: Secondary | ICD-10-CM | POA: Insufficient documentation

## 2016-05-11 DIAGNOSIS — R369 Urethral discharge, unspecified: Secondary | ICD-10-CM | POA: Diagnosis present

## 2016-05-11 LAB — URINALYSIS, ROUTINE W REFLEX MICROSCOPIC
BILIRUBIN URINE: NEGATIVE
GLUCOSE, UA: NEGATIVE mg/dL
Hgb urine dipstick: NEGATIVE
KETONES UR: NEGATIVE mg/dL
Nitrite: NEGATIVE
PH: 7.5 (ref 5.0–8.0)
PROTEIN: NEGATIVE mg/dL
Specific Gravity, Urine: 1.009 (ref 1.005–1.030)

## 2016-05-11 LAB — URINE MICROSCOPIC-ADD ON: RBC / HPF: NONE SEEN RBC/hpf (ref 0–5)

## 2016-05-11 MED ORDER — STERILE WATER FOR INJECTION IJ SOLN
INTRAMUSCULAR | Status: AC
  Filled 2016-05-11: qty 10

## 2016-05-11 MED ORDER — CEFTRIAXONE SODIUM 250 MG IJ SOLR
250.0000 mg | Freq: Once | INTRAMUSCULAR | Status: AC
  Administered 2016-05-11: 250 mg via INTRAMUSCULAR
  Filled 2016-05-11: qty 250

## 2016-05-11 MED ORDER — CIPROFLOXACIN HCL 500 MG PO TABS
500.0000 mg | ORAL_TABLET | Freq: Two times a day (BID) | ORAL | Status: DC
Start: 2016-05-11 — End: 2018-07-12

## 2016-05-11 MED ORDER — AZITHROMYCIN 250 MG PO TABS
1000.0000 mg | ORAL_TABLET | Freq: Once | ORAL | Status: AC
  Administered 2016-05-11: 1000 mg via ORAL
  Filled 2016-05-11: qty 4

## 2016-05-11 NOTE — ED Notes (Signed)
Declined W/C at D/C and was escorted to lobby by RN. 

## 2016-05-11 NOTE — ED Notes (Signed)
Pt reports to the ED for eval of white penile d/c x 4 days. Reports last time he had unprotected sex on Sunday. Also reports some dysuria. Denies any abd pain or N/V/D. Denies any lesions. Pt A&Ox4, resp e/u, and skin warm and dry.

## 2016-05-11 NOTE — ED Provider Notes (Signed)
History  By signing my name below, I, Earmon PhoenixJennifer Waddell, attest that this documentation has been prepared under the direction and in the presence of Teressa LowerVrinda Jolon Degante, FNP. Electronically Signed: Earmon PhoenixJennifer Waddell, ED Scribe. 05/11/2016. 2:40 PM.  Chief Complaint  Patient presents with  . Penile Discharge   The history is provided by the patient and medical records. No language interpreter was used.    HPI Comments:  [name] is a 35 y.o. male who presents to the Emergency Department complaining of white penile discharge that began 5 days ago. He reports associated difficulty urinating and dysuria. He reports having unprotected sex with a new partner about 8 days ago. He has not done anything to treat his symptoms. He denies modifying factors. He denies rash, abdominal pain, fever, chills, nausea, vomiting, penile or scrotal swelling.   History reviewed. No pertinent past medical history. Past Surgical History  Procedure Laterality Date  . Cholecystectomy     Family History  Problem Relation Age of Onset  . Hypertension Mother    Social History  Substance Use Topics  . Smoking status: Current Every Day Smoker -- 0.50 packs/day    Types: Cigarettes  . Smokeless tobacco: None  . Alcohol Use: No    Review of Systems  Constitutional: Negative for fever and chills.  Gastrointestinal: Negative for nausea, vomiting and abdominal pain.  Genitourinary: Positive for dysuria, discharge and difficulty urinating. Negative for penile swelling and scrotal swelling.  Skin: Negative for rash.  All other systems reviewed and are negative.   Allergies  Review of patient's allergies indicates no known allergies.  Home Medications   Prior to Admission medications   Medication Sig Start Date End Date Taking? Authorizing Provider  ibuprofen (ADVIL,MOTRIN) 800 MG tablet Take 1 tablet (800 mg total) by mouth 3 (three) times daily. 02/27/16   Fayrene HelperBowie Tran, PA-C  meloxicam (MOBIC) 15 MG tablet Take 1  tablet (15 mg total) by mouth daily. 07/25/14   [name], PA-C  methocarbamol (ROBAXIN) 500 MG tablet Take 1 tablet (500 mg total) by mouth 2 (two) times daily. 07/25/14   [name], PA-C  penicillin v potassium (VEETID) 500 MG tablet Take 1 tablet (500 mg total) by mouth 3 (three) times daily. 02/27/16   Fayrene HelperBowie Tran, PA-C  traMADol (ULTRAM) 50 MG tablet Take 1 tablet (50 mg total) by mouth every 6 (six) hours as needed. 02/27/16   Fayrene HelperBowie Tran, PA-C   Triage Vitals: BP 131/100 mmHg  Pulse 91  Temp(Src) 97.8 F (36.6 C) (Oral)  Resp 16  SpO2 98% Physical Exam  Constitutional: He is oriented to person, place, and time. He appears well-developed and well-nourished.  HENT:  Head: Normocephalic and atraumatic.  Eyes: EOM are normal.  Neck: Normal range of motion.  Cardiovascular: Normal rate.   Pulmonary/Chest: Effort normal.  Genitourinary:  Clear penile discharge  Musculoskeletal: Normal range of motion.  Neurological: He is alert and oriented to person, place, and time.  Skin: Skin is warm and dry.  Psychiatric: He has a normal mood and affect. His behavior is normal.  Nursing note and vitals reviewed.   ED Course  Procedures (including critical care time) DIAGNOSTIC STUDIES: Oxygen Saturation is 98% on RA, normal by my interpretation.   COORDINATION OF CARE: 2:39 PM- Will check and treat for GC/chlamydia. Pt verbalizes understanding and agrees to plan.  Medications - No data to display  Labs Review Labs Reviewed  URINALYSIS, ROUTINE W REFLEX MICROSCOPIC ([facility])  GC/CHLAMYDIA PROBE AMP ([facility]) NOT AT [facility]    Imaging Review No results found. I have personally reviewed and evaluated these images and lab results as part of my medical decision-making.   EKG Interpretation None      MDM   Final diagnoses:  Urethritis    Pt treated here with zithromax and rocephin. Discussed safe sex practices  I personally performed the services  described in this documentation, which was scribed in my presence. The recorded information has been reviewed and is accurate.     Teressa Lower, NP 05/11/16 1546  Mancel Bale, MD 05/11/16 [phone]

## 2016-05-11 NOTE — Discharge Instructions (Signed)
Urethritis, Adult °Urethritis is an inflammation of the tube through which urine exits your bladder (urethra).  °CAUSES °Urethritis is often caused by an infection in your urethra. The infection can be viral, like herpes. The infection can also be bacterial, like gonorrhea. °RISK FACTORS °Risk factors of urethritis include: °· Having sex without using a condom. °· Having multiple sexual partners. °· Having poor hygiene. °SIGNS AND SYMPTOMS °Symptoms of urethritis are less noticeable in women than in men. These symptoms include: °· Burning feeling when you urinate (dysuria). °· Discharge from your urethra. °· Blood in your urine (hematuria). °· Urinating more than usual. °DIAGNOSIS  °To confirm a diagnosis of urethritis, your health care provider will do the following: °· Ask about your sexual history. °· Perform a physical exam. °· Have you provide a sample of your urine for lab testing. °· Use a cotton swab to gently collect a sample from your urethra for lab testing. °TREATMENT  °It is important to treat urethritis. Depending on the cause, untreated urethritis may lead to serious genital infections and possibly infertility. Urethritis caused by a bacterial infection is treated with antibiotic medicine. All sexual partners must be treated.  °HOME CARE INSTRUCTIONS °· Do not have sex until the test results are known and treatment is completed, even if your symptoms go away before you finish treatment. °· If you were prescribed an antibiotic, finish it all even if you start to feel better. °SEEK MEDICAL CARE IF:  °· Your symptoms are not improved in 3 days. °· Your symptoms are getting worse. °· You develop abdominal pain or pelvic pain (in women). °· You develop joint pain. °· You have a fever. °SEEK IMMEDIATE MEDICAL CARE IF:  °· You have severe pain in the belly, back, or side. °· You have repeated vomiting. °MAKE SURE YOU: °· Understand these instructions. °· Will watch your condition. °· Will get help right away  if you are not doing well or get worse. °  °This information is not intended to replace advice given to you by your health care provider. Make sure you discuss any questions you have with your health care provider. °  °Document Released: 05/12/2001 Document Revised: 04/02/2015 Document Reviewed: 07/17/2013 °Elsevier Interactive Patient Education ©2016 [employer] ° °

## 2016-05-12 LAB — GC/CHLAMYDIA PROBE AMP (~~LOC~~) NOT AT ARMC
Chlamydia: NEGATIVE
NEISSERIA GONORRHEA: NEGATIVE

## 2016-08-12 ENCOUNTER — Encounter (HOSPITAL_COMMUNITY): Payer: Self-pay | Admitting: Emergency Medicine

## 2016-08-12 ENCOUNTER — Emergency Department (HOSPITAL_COMMUNITY)
Admission: EM | Admit: 2016-08-12 | Discharge: 2016-08-12 | Disposition: A | Discharge status: Home / Self Care | Payer: BLUE CROSS/BLUE SHIELD | Attending: Dermatology | Admitting: Dermatology

## 2016-08-12 DIAGNOSIS — R369 Urethral discharge, unspecified: Secondary | ICD-10-CM | POA: Diagnosis not present

## 2016-08-12 DIAGNOSIS — Z5321 Procedure and treatment not carried out due to patient leaving prior to being seen by health care provider: Secondary | ICD-10-CM | POA: Insufficient documentation

## 2016-08-12 DIAGNOSIS — F17210 Nicotine dependence, cigarettes, uncomplicated: Secondary | ICD-10-CM | POA: Diagnosis not present

## 2016-08-12 NOTE — ED Triage Notes (Signed)
Pt from home via EMS with complaints of pain with urination and white penile discharge. Pt does not practice safe sex.

## 2016-08-12 NOTE — ED Notes (Signed)
Pt told front desk clerk "we were taking to long" and gave them his labels.

## 2016-09-01 LAB — GLUCOSE, POCT (MANUAL RESULT ENTRY): POC GLUCOSE: 103 mg/dL — AB (ref 70–99)

## 2016-11-25 ENCOUNTER — Emergency Department (HOSPITAL_COMMUNITY)
Admission: EM | Admit: 2016-11-25 | Discharge: 2016-11-25 | Disposition: A | Discharge status: Home / Self Care | Payer: BLUE CROSS/BLUE SHIELD | Attending: Emergency Medicine | Admitting: Emergency Medicine

## 2016-11-25 ENCOUNTER — Encounter (HOSPITAL_COMMUNITY): Payer: Self-pay

## 2016-11-25 DIAGNOSIS — F17210 Nicotine dependence, cigarettes, uncomplicated: Secondary | ICD-10-CM | POA: Insufficient documentation

## 2016-11-25 DIAGNOSIS — B029 Zoster without complications: Secondary | ICD-10-CM

## 2016-11-25 DIAGNOSIS — K047 Periapical abscess without sinus: Secondary | ICD-10-CM | POA: Insufficient documentation

## 2016-11-25 MED ORDER — VALACYCLOVIR HCL 1 G PO TABS: 1000.0000 mg | ORAL_TABLET | Freq: Three times a day (TID) | ORAL | 0 refills | Status: DC

## 2016-11-25 MED ORDER — PENICILLIN V POTASSIUM 500 MG PO TABS: 500.0000 mg | ORAL_TABLET | Freq: Four times a day (QID) | ORAL | 0 refills | Status: AC

## 2016-11-25 MED ORDER — HYDROCODONE-ACETAMINOPHEN 5-325 MG PO TABS: 1.0000 | ORAL_TABLET | Freq: Four times a day (QID) | ORAL | 0 refills | Status: DC | PRN

## 2016-11-25 NOTE — ED Provider Notes (Signed)
[facility]-EMERGENCY DEPT Provider Note   CSN: [id number] Arrival date & time: 11/25/16  1015   By signing my name below, I, [name], attest that this documentation has been prepared under the direction and in the presence of Andres Crumbleatyana Arman Loy, PA-C Electronically Signed: Cynda AcresHailei Hawkins, Scribe. 11/25/16. 10:54 AM.   History   Chief Complaint Chief Complaint  Patient presents with  . Dental Pain  . Rash    HPI Comments: [name] is a 35 y.o. male who presents to the Emergency Department complaining of gradual onset of dental pain that began four days ago. He reports taking "antibiotics" since yesterday (pulls out a white capsule wrapped in tissue paper) to make the swelling go down. States got this pill from a friend. He states he does have a  primary dentist in which he is supposed to follow-up with next month to have his tooth pulled. Reports associated facial swelling that began today.   He also reports a rash on the right trunk that appeared 6 days ago. States rash is very painful. No treatment prior to coming in. States he has had night sweats due to pain. Reports staying at a hotel last week. No other rashes anywhere on the body. He denies any other symptoms.   The history is provided by the patient. No language interpreter was used.    History reviewed. No pertinent past medical history.  Patient Active Problem List   Diagnosis Date Noted  . NEUTROPENIA UNSPECIFIED 11/20/2008  . GALLSTONE PANCREATITIS 11/14/2008  . TACHYCARDIA 11/14/2008    Past Surgical History:  Procedure Laterality Date  . CHOLECYSTECTOMY         Home Medications    Prior to Admission medications   Medication Sig Start Date End Date Taking? Authorizing Provider  ciprofloxacin (CIPRO) 500 MG tablet Take 1 tablet (500 mg total) by mouth 2 (two) times daily. 05/11/16   Teressa LowerVrinda Pickering, NP  ibuprofen (ADVIL,MOTRIN) 800 MG tablet Take 1 tablet (800 mg total) by mouth 3 (three) times  daily. Patient not taking: Reported on 05/11/2016 02/27/16   Fayrene HelperBowie Tran, PA-C  meloxicam (MOBIC) 15 MG tablet Take 1 tablet (15 mg total) by mouth daily. Patient not taking: Reported on 05/11/2016 07/25/14   Dahlia ClientHannah Muthersbaugh, PA-C  methocarbamol (ROBAXIN) 500 MG tablet Take 1 tablet (500 mg total) by mouth 2 (two) times daily. Patient not taking: Reported on 05/11/2016 07/25/14   Dahlia ClientHannah Muthersbaugh, PA-C  penicillin v potassium (VEETID) 500 MG tablet Take 1 tablet (500 mg total) by mouth 3 (three) times daily. Patient not taking: Reported on 05/11/2016 02/27/16   Fayrene HelperBowie Tran, PA-C  traMADol (ULTRAM) 50 MG tablet Take 1 tablet (50 mg total) by mouth every 6 (six) hours as needed. Patient not taking: Reported on 05/11/2016 02/27/16   Fayrene HelperBowie Tran, PA-C    Family History Family History  Problem Relation Age of Onset  . Hypertension Mother     Social History Social History  Substance Use Topics  . Smoking status: Current Every Day Smoker    Packs/day: 0.50    Types: Cigarettes  . Smokeless tobacco: Never Used  . Alcohol use No     Allergies   Patient has no known allergies.   Review of Systems Review of Systems  Constitutional: Negative for chills and fever.  HENT: Positive for dental problem and facial swelling.   Skin: Positive for rash and wound.  Neurological: Positive for headaches.     Physical Exam Updated Vital Signs BP 118/89 (BP Location:  Left Arm)   Pulse 109   Temp 97.8 F (36.6 C) (Oral)   Resp 18   SpO2 99%   Physical Exam  Constitutional: He appears well-developed and well-nourished. No distress.  HENT:  Mild left sided facial swelling noted. Left upper first premolar with a large cavity. Tender to palpation. Mild surrounding gum swelling. No obvious abscess. No trismus. No swelling under the tongue.  Eyes: Conjunctivae are normal.  Neck: Neck supple.  Cardiovascular: Normal rate.   Pulmonary/Chest: No respiratory distress.  Abdominal: He exhibits no  distension.  Skin: Skin is warm and dry.     Vesicular and frustrated over rash on erythematous base to the right flank extending into the right lower ribs/upper abdominal area. Rash does not cross midline of the abdomen or back. It is tender to the touch.  Nursing note and vitals reviewed.    ED Treatments / Results  DIAGNOSTIC STUDIES: Oxygen Saturation is 99% on RA, normal by my interpretation.    COORDINATION OF CARE: 10:54 AM Discussed treatment plan with pt at bedside and pt agreed to plan.   Labs (all labs ordered are listed, but only abnormal results are displayed) Labs Reviewed - No data to display  EKG  EKG Interpretation None       Radiology No results found.  Procedures Procedures (including critical care time)  Medications Ordered in ED Medications - No data to display   Initial Impression / Assessment and Plan / ED Course  I have reviewed the triage vital signs and the nursing notes.  Pertinent labs & imaging results that were available during my care of the patient were reviewed by me and considered in my medical decision making (see chart for details).  Clinical Course    Patient didn't emergency department with 2 separate complaints. He is complaining of dental pain, on exam, patient does have facial swelling and a large cavity in the left upper first premolar. He has an appointment with a dentist on January 10. No concern for Ludwig's angina at this time. Will treat with penicillin, instructed to follow with a dentist.  Patient's rash is consistent with herpes zoster. Patches of blistered and crusted lesions with erythema to the right flank, does not cross midline, very tender.Will start on a Valtrex. Will give Norco for pain. Follow-up with family doctor.  Patient is stable for discharge home, he is nontoxic appearing, no distress. Initial heart rate 109, will recheck. He is afebrile.  Vitals:   11/25/16 1053 11/25/16 1120  BP: 118/89   Pulse:  109 99  Resp: 18   Temp: 97.8 F (36.6 C)   TempSrc: Oral   SpO2: 99%      Final Clinical Impressions(s) / ED Diagnoses   Final diagnoses:  Herpes zoster without complication  Dental abscess    New Prescriptions New Prescriptions   HYDROCODONE-ACETAMINOPHEN (NORCO) 5-325 MG TABLET    Take 1 tablet by mouth every 6 (six) hours as needed for moderate pain.   PENICILLIN V POTASSIUM (VEETID) 500 MG TABLET    Take 1 tablet (500 mg total) by mouth 4 (four) times daily.   VALACYCLOVIR (VALTREX) 1000 MG TABLET    Take 1 tablet (1,000 mg total) by mouth 3 (three) times daily.    I personally performed the services described in this documentation, which was scribed in my presence. The recorded information has been reviewed and is accurate.     Andres Crumbleatyana Maddon Horton, PA-C 11/25/16 1256    Loren Raceravid Yelverton, MD 11/25/16  1559  

## 2016-11-25 NOTE — Discharge Instructions (Signed)
Take valtrex as prescried until all gone for shingles. Take penicillin as prescribed until all gone for dental infection. Ibprofen for pain. Norco for severe pain. Follow up with a dentist and family doctor for recheck.

## 2016-11-25 NOTE — ED Triage Notes (Signed)
Pt with sores/rash to right side.  Started a week ago with blisters.  Dried and crusted at this time.  Pt states site painful with headaches.  Pt claims new hotel.  Denies hx of chicken pox.  Pt also complains of dental pain and swelling.

## 2017-02-19 ENCOUNTER — Emergency Department (HOSPITAL_COMMUNITY): Payer: Self-pay

## 2017-02-19 ENCOUNTER — Encounter (HOSPITAL_COMMUNITY): Payer: Self-pay | Admitting: *Deleted

## 2017-02-19 ENCOUNTER — Emergency Department (HOSPITAL_COMMUNITY)
Admission: EM | Admit: 2017-02-19 | Discharge: 2017-02-19 | Disposition: A | Discharge status: Home / Self Care | Payer: Self-pay | Attending: Emergency Medicine | Admitting: Emergency Medicine

## 2017-02-19 DIAGNOSIS — F17210 Nicotine dependence, cigarettes, uncomplicated: Secondary | ICD-10-CM | POA: Insufficient documentation

## 2017-02-19 DIAGNOSIS — S62633B Displaced fracture of distal phalanx of left middle finger, initial encounter for open fracture: Secondary | ICD-10-CM

## 2017-02-19 DIAGNOSIS — S62633A Displaced fracture of distal phalanx of left middle finger, initial encounter for closed fracture: Secondary | ICD-10-CM | POA: Insufficient documentation

## 2017-02-19 DIAGNOSIS — S61213A Laceration without foreign body of left middle finger without damage to nail, initial encounter: Secondary | ICD-10-CM

## 2017-02-19 DIAGNOSIS — Y999 Unspecified external cause status: Secondary | ICD-10-CM | POA: Insufficient documentation

## 2017-02-19 DIAGNOSIS — Z23 Encounter for immunization: Secondary | ICD-10-CM | POA: Insufficient documentation

## 2017-02-19 DIAGNOSIS — W010XXA Fall on same level from slipping, tripping and stumbling without subsequent striking against object, initial encounter: Secondary | ICD-10-CM | POA: Insufficient documentation

## 2017-02-19 DIAGNOSIS — Y9302 Activity, running: Secondary | ICD-10-CM | POA: Insufficient documentation

## 2017-02-19 DIAGNOSIS — Y92410 Unspecified street and highway as the place of occurrence of the external cause: Secondary | ICD-10-CM | POA: Insufficient documentation

## 2017-02-19 MED ORDER — TETANUS-DIPHTH-ACELL PERTUSSIS 5-2.5-18.5 LF-MCG/0.5 IM SUSP
0.5000 mL | Freq: Once | INTRAMUSCULAR | Status: AC
  Administered 2017-02-19: 0.5 mL via INTRAMUSCULAR
  Filled 2017-02-19: qty 0.5

## 2017-02-19 NOTE — Discharge Instructions (Signed)
As discussed, I spoke to the hand specialist and they are expecting you when you leave here. They will perform the repair and close the laceration.

## 2017-02-19 NOTE — ED Provider Notes (Signed)
[facility] Provider Note   CSN: [id number] Arrival date & time: 02/19/17  1032   By signing my name below, I, Soijett Blue, attest that this documentation has been prepared under the direction and in the presence of Mathews Robinsons, PA-C Electronically Signed: Soijett Blue, ED Scribe. 02/19/17. 11:59 AM.  History   Chief Complaint Chief Complaint  Patient presents with  . Extremity Laceration    HPI [name] is a 36 y.o. male who presents to the Emergency Department BIB EMS complaining of left middle finger laceration onset PTA. Pt has tried applying pressure without medications for the relief of his symptoms. He notes that he was running to catch the bus when he tripped and fell on his outstretched left hand causing a laceration to his left middle finger due to a nail. Pt reports that he is not UTD with his tetanus vaccination at this time. He denies numbness, hitting his head, LOC, left middle finger pain, swelling, and any other symptoms. Denies PMHx, daily medication use, or allergies to medications.    The history is provided by the patient and the EMS personnel. No language interpreter was used.    History reviewed. No pertinent past medical history.  Patient Active Problem List   Diagnosis Date Noted  . NEUTROPENIA UNSPECIFIED 11/20/2008  . GALLSTONE PANCREATITIS 11/14/2008  . TACHYCARDIA 11/14/2008    Past Surgical History:  Procedure Laterality Date  . CHOLECYSTECTOMY         Home Medications    Prior to Admission medications   Medication Sig Start Date End Date Taking? Authorizing Provider  ciprofloxacin (CIPRO) 500 MG tablet Take 1 tablet (500 mg total) by mouth 2 (two) times daily. 05/11/16   Teressa Lower, NP  HYDROcodone-acetaminophen (NORCO) 5-325 MG tablet Take 1 tablet by mouth every 6 (six) hours as needed for moderate pain. 11/25/16   Tatyana Kirichenko, PA-C  ibuprofen (ADVIL,MOTRIN) 800 MG tablet Take 1 tablet (800 mg total) by mouth 3  (three) times daily. Patient not taking: Reported on 05/11/2016 02/27/16   Fayrene Helper, PA-C  meloxicam (MOBIC) 15 MG tablet Take 1 tablet (15 mg total) by mouth daily. Patient not taking: Reported on 05/11/2016 07/25/14   Dahlia Client Muthersbaugh, PA-C  methocarbamol (ROBAXIN) 500 MG tablet Take 1 tablet (500 mg total) by mouth 2 (two) times daily. Patient not taking: Reported on 05/11/2016 07/25/14   Dahlia Client Muthersbaugh, PA-C  traMADol (ULTRAM) 50 MG tablet Take 1 tablet (50 mg total) by mouth every 6 (six) hours as needed. Patient not taking: Reported on 05/11/2016 02/27/16   Fayrene Helper, PA-C  valACYclovir (VALTREX) 1000 MG tablet Take 1 tablet (1,000 mg total) by mouth 3 (three) times daily. 11/25/16   Jaynie Crumble, PA-C    Family History Family History  Problem Relation Age of Onset  . Hypertension Mother     Social History Social History  Substance Use Topics  . Smoking status: Current Every Day Smoker    Packs/day: 0.50    Types: Cigarettes  . Smokeless tobacco: Never Used  . Alcohol use No     Allergies   Patient has no known allergies.   Review of Systems Review of Systems  Musculoskeletal: Negative for arthralgias and joint swelling.  Skin: Positive for wound (laceration to left middle finger).  Neurological: Negative for syncope and numbness.     Physical Exam Updated Vital Signs BP 124/85 (BP Location: Right Arm)   Pulse 92   Temp 97.6 F (36.4 C)   Resp 14  Ht 5\' 10"  (1.778 m)   Wt 81.6 kg   SpO2 100%   BMI 25.83 kg/m   Physical Exam  Constitutional: He is oriented to person, place, and time. He appears well-developed and well-nourished. No distress.  Patient is afebrile, non-toxic appearing, seating comfortably in chair in no acute distress.  HENT:  Head: Normocephalic and atraumatic.  Eyes: EOM are normal.  Neck: Neck supple.  Cardiovascular: Normal rate.   Pulmonary/Chest: Effort normal. No respiratory distress.  Abdominal: He exhibits no  distension.  Musculoskeletal: Normal range of motion.       Left hand: He exhibits laceration. He exhibits normal range of motion.  U-shaped laceration at distal portion of left middle finger. FROM. Bleeding is controlled.  Neurological: He is alert and oriented to person, place, and time.  Skin: Skin is warm and dry.  Psychiatric: He has a normal mood and affect. His behavior is normal.  Nursing note and vitals reviewed.    ED Treatments / Results  DIAGNOSTIC STUDIES: Oxygen Saturation is 100% on RA, nl by my interpretation.    COORDINATION OF CARE: 11:58 AM Discussed treatment plan with pt at bedside which includes update tetanus vaccination, left hand xray, consult with orthopedist, and pt agreed to plan.  12:30 PM- Consult with hand surgeon Dr. Amanda Pea who recommends, follow up in office.  Radiology Dg Hand Complete Left  Result Date: 02/19/2017 CLINICAL DATA:  Fall laceration to distal third digit EXAM: LEFT HAND - COMPLETE 3+ VIEW COMPARISON:  None. FINDINGS: Frontal, oblique and lateral views were obtained. There is soft tissue injury to the distal aspect third digit. There is a fracture of the volar, lateral aspect of the distal aspect of the third distal phalanx. Fracture fragment is displaced laterally and volar to the remainder of the digit. No other fracture. No dislocation. Joint spaces appear normal. No erosive change. No radiopaque foreign body. IMPRESSION: Soft tissue injury to distal third digit with fracture along the volar, lateral aspect of the distal portion of the third distal phalanx. Fracture fragment displaced from remainder of bone. No other fracture. No dislocation. No evident arthropathy. No radiopaque foreign body. Electronically Signed   By: Bretta Bang III M.D.   On: 02/19/2017 12:13    Procedures Procedures (including critical care time)  Medications Ordered in ED Medications  Tdap (BOOSTRIX) injection 0.5 mL (0.5 mLs Intramuscular Given 02/19/17  1226)     Initial Impression / Assessment and Plan / ED Course  I have reviewed the triage vital signs and the nursing notes.  Pertinent imaging results that were available during my care of the patient were reviewed by me and considered in my medical decision making (see chart for details).    Patient presents after a slip and fall injuring his left middle finger. No loss of consciousness or other injuries. Tetanus updated in ED. Xray positive for Soft tissue injury to distal third digit with open fracture of distal portion of the third distal phalanx. Fracture fragment displaced from remainder of bone. No radiopaque foreign body. Full ROm of asll fingers, no numbness,no nail involvement. Consulted Orthopedist, Dr. Amanda Pea, who recommends follow up in office today for further evaluation.   Spoke to Dr. Amanda Pea who will be fitting patient in this afternoon. Recommended that patient's wound be dressed in wet to dry dressing and sent over to the clinic when he leaves the ED. They will be performing fracture repair,  Lac repair and initiating antibiotics. Pt appears safe for discharge and is  agreeable to said plan.  Discussed strict return precautions and advised to return to the emergency department if experiencing any new or worsening symptoms. Instructions were understood and patient agreed with discharge plan.   Final Clinical Impressions(s) / ED Diagnoses   Final diagnoses:  Open displaced fracture of distal phalanx of left middle finger, initial encounter  Laceration of left middle finger without foreign body without damage to nail, initial encounter    New Prescriptions New Prescriptions   No medications on file   I personally performed the services described in this documentation, which was scribed in my presence. The recorded information has been reviewed and is accurate.     Georgiana ShoreJessica B Johnatan Baskette, PA-C 02/19/17 1304    Tilden FossaElizabeth Rees, MD 02/22/17 1538

## 2017-02-19 NOTE — ED Triage Notes (Signed)
Pt was running to catch the city bus when he tripped and fell and sustained a laceration to his middle finger and a more superficial lac to his pointer finger.  Pt states that he feels that this lac was caused by a nail.  EMS states that there was much glass and debris in the bush.  Pt states that his last tetanus shot was last year.  Pt has dressing in place.

## 2018-07-12 ENCOUNTER — Emergency Department (HOSPITAL_COMMUNITY): Payer: No Typology Code available for payment source

## 2018-07-12 ENCOUNTER — Other Ambulatory Visit: Payer: Self-pay

## 2018-07-12 ENCOUNTER — Encounter (HOSPITAL_COMMUNITY): Payer: Self-pay

## 2018-07-12 ENCOUNTER — Emergency Department (HOSPITAL_COMMUNITY)
Admission: EM | Admit: 2018-07-12 | Discharge: 2018-07-12 | Disposition: A | Discharge status: Home / Self Care | Payer: No Typology Code available for payment source | Attending: Emergency Medicine | Admitting: Emergency Medicine

## 2018-07-12 DIAGNOSIS — M542 Cervicalgia: Secondary | ICD-10-CM | POA: Insufficient documentation

## 2018-07-12 DIAGNOSIS — R0602 Shortness of breath: Secondary | ICD-10-CM | POA: Insufficient documentation

## 2018-07-12 DIAGNOSIS — Z79899 Other long term (current) drug therapy: Secondary | ICD-10-CM | POA: Insufficient documentation

## 2018-07-12 DIAGNOSIS — R1031 Right lower quadrant pain: Secondary | ICD-10-CM | POA: Diagnosis not present

## 2018-07-12 DIAGNOSIS — Z9049 Acquired absence of other specified parts of digestive tract: Secondary | ICD-10-CM | POA: Diagnosis not present

## 2018-07-12 DIAGNOSIS — M25511 Pain in right shoulder: Secondary | ICD-10-CM | POA: Insufficient documentation

## 2018-07-12 DIAGNOSIS — F17210 Nicotine dependence, cigarettes, uncomplicated: Secondary | ICD-10-CM | POA: Diagnosis not present

## 2018-07-12 DIAGNOSIS — R079 Chest pain, unspecified: Secondary | ICD-10-CM | POA: Diagnosis not present

## 2018-07-12 LAB — CBC
HCT: 43.6 % (ref 39.0–52.0)
HEMOGLOBIN: 15.2 g/dL (ref 13.0–17.0)
MCH: 32.8 pg (ref 26.0–34.0)
MCHC: 34.9 g/dL (ref 30.0–36.0)
MCV: 94 fL (ref 78.0–100.0)
PLATELETS: 202 10*3/uL (ref 150–400)
RBC: 4.64 MIL/uL (ref 4.22–5.81)
RDW: 12.4 % (ref 11.5–15.5)
WBC: 4.2 10*3/uL (ref 4.0–10.5)

## 2018-07-12 LAB — COMPREHENSIVE METABOLIC PANEL
ALK PHOS: 45 U/L (ref 38–126)
ALT: 23 U/L (ref 0–44)
ANION GAP: 13 (ref 5–15)
AST: 27 U/L (ref 15–41)
Albumin: 4.4 g/dL (ref 3.5–5.0)
BILIRUBIN TOTAL: 1.6 mg/dL — AB (ref 0.3–1.2)
BUN: 13 mg/dL (ref 6–20)
CALCIUM: 9.5 mg/dL (ref 8.9–10.3)
CO2: 27 mmol/L (ref 22–32)
CREATININE: 0.99 mg/dL (ref 0.61–1.24)
Chloride: 100 mmol/L (ref 98–111)
GFR calc non Af Amer: >60 mL/min (ref >60)
Glucose, Bld: 100 mg/dL — ABNORMAL HIGH (ref 70–99)
Potassium: 3.2 mmol/L — ABNORMAL LOW (ref 3.5–5.1)
SODIUM: 140 mmol/L (ref 135–145)
TOTAL PROTEIN: 7 g/dL (ref 6.5–8.1)

## 2018-07-12 MED ORDER — SODIUM CHLORIDE 0.9 % IV BOLUS
500.0000 mL | Freq: Once | INTRAVENOUS | Status: AC
  Administered 2018-07-12: 500 mL via INTRAVENOUS

## 2018-07-12 MED ORDER — IOHEXOL 300 MG/ML  SOLN
100.0000 mL | Freq: Once | INTRAMUSCULAR | Status: AC | PRN
  Administered 2018-07-12: 100 mL via INTRAVENOUS

## 2018-07-12 MED ORDER — METHOCARBAMOL 500 MG PO TABS: 500.0000 mg | ORAL_TABLET | Freq: Three times a day (TID) | ORAL | 0 refills | Status: DC | PRN

## 2018-07-12 MED ORDER — NAPROXEN 500 MG PO TABS: 500.0000 mg | ORAL_TABLET | Freq: Two times a day (BID) | ORAL | 0 refills | Status: DC

## 2018-07-12 MED ORDER — FENTANYL CITRATE (PF) 100 MCG/2ML IJ SOLN
50.0000 ug | Freq: Once | INTRAMUSCULAR | Status: AC
  Administered 2018-07-12: 50 ug via INTRAVENOUS
  Filled 2018-07-12: qty 2

## 2018-07-12 MED ORDER — POTASSIUM CHLORIDE CRYS ER 20 MEQ PO TBCR: 20.0000 meq | EXTENDED_RELEASE_TABLET | Freq: Every day | ORAL | 0 refills | Status: DC

## 2018-07-12 MED ORDER — POTASSIUM CHLORIDE CRYS ER 20 MEQ PO TBCR
40.0000 meq | EXTENDED_RELEASE_TABLET | Freq: Once | ORAL | Status: AC
  Administered 2018-07-12: 40 meq via ORAL
  Filled 2018-07-12: qty 2
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
## 2018-09-27 LAB — GLUCOSE, POCT (MANUAL RESULT ENTRY): POC GLUCOSE: 149 mg/dL — AB (ref 70–99)

## 2019-01-19 ENCOUNTER — Emergency Department (HOSPITAL_COMMUNITY)
Admission: EM | Admit: 2019-01-19 | Discharge: 2019-01-19 | Disposition: A | Discharge status: Home / Self Care | Payer: BLUE CROSS/BLUE SHIELD | Attending: Emergency Medicine | Admitting: Emergency Medicine

## 2019-01-19 ENCOUNTER — Other Ambulatory Visit: Payer: Self-pay

## 2019-01-19 ENCOUNTER — Encounter (HOSPITAL_COMMUNITY): Payer: Self-pay

## 2019-01-19 DIAGNOSIS — Z7982 Long term (current) use of aspirin: Secondary | ICD-10-CM | POA: Insufficient documentation

## 2019-01-19 DIAGNOSIS — F17210 Nicotine dependence, cigarettes, uncomplicated: Secondary | ICD-10-CM | POA: Insufficient documentation

## 2019-01-19 DIAGNOSIS — K029 Dental caries, unspecified: Secondary | ICD-10-CM | POA: Insufficient documentation

## 2019-01-19 DIAGNOSIS — Z79899 Other long term (current) drug therapy: Secondary | ICD-10-CM | POA: Insufficient documentation

## 2019-01-19 DIAGNOSIS — J0100 Acute maxillary sinusitis, unspecified: Secondary | ICD-10-CM | POA: Insufficient documentation

## 2019-01-19 MED ORDER — AMOXICILLIN 500 MG PO CAPS: 500.0000 mg | ORAL_CAPSULE | Freq: Three times a day (TID) | ORAL | 0 refills | Status: DC

## 2019-01-19 MED ORDER — HYDROCODONE-ACETAMINOPHEN 5-325 MG PO TABS: 1.0000 | ORAL_TABLET | Freq: Four times a day (QID) | ORAL | 0 refills | Status: DC | PRN

## 2019-01-19 NOTE — ED Provider Notes (Signed)
[facility] Note   CSN: [id number] Arrival date & time: 01/19/19  6160    History   Chief Complaint Chief Complaint  Patient presents with  . Dental Pain    HPI [name] is a 38 y.o. male.     HPI Patient presents with dental pain.  Has had for the last 3 days.  States it is on both sides of his mouth.  States her teeth are loose and there.  States has not been able to sleep for 3 days because of it.  Does not have a dentist.  States also he has pain in his mid face.  States he has had nasal drainage.  No fevers.  No difficulty breathing. History reviewed. No pertinent past medical history.  Patient Active Problem List   Diagnosis Date Noted  . NEUTROPENIA UNSPECIFIED 11/20/2008  . GALLSTONE PANCREATITIS 11/14/2008  . TACHYCARDIA 11/14/2008    Past Surgical History:  Procedure Laterality Date  . CHOLECYSTECTOMY          Home Medications    Prior to Admission medications   Medication Sig Start Date End Date Taking? Authorizing Provider  aspirin 325 MG tablet Take 975 mg by mouth daily as needed for mild pain or headache.   Yes [provider]  amoxicillin (AMOXIL) 500 MG capsule Take 1 capsule (500 mg total) by mouth 3 (three) times daily. 01/19/19   Benjiman Core, MD  HYDROcodone-acetaminophen (NORCO/VICODIN) 5-325 MG tablet Take 1-2 tablets by mouth every 6 (six) hours as needed. 01/19/19   Benjiman Core, MD  methocarbamol (ROBAXIN) 500 MG tablet Take 1 tablet (500 mg total) by mouth every 8 (eight) hours as needed. Patient not taking: Reported on 01/19/2019 07/12/18   Petrucelli, Lelon Mast R, PA-C  naproxen (NAPROSYN) 500 MG tablet Take 1 tablet (500 mg total) by mouth 2 (two) times daily. Patient not taking: Reported on 01/19/2019 07/12/18   Petrucelli, Lelon Mast R, PA-C  potassium chloride SA (K-DUR,KLOR-CON) 20 MEQ tablet Take 1 tablet (20 mEq total) by mouth daily. Patient not taking: Reported on 01/19/2019  07/12/18   Petrucelli, Pleas Koch, PA-C    Family History Family History  Problem Relation Age of Onset  . Hypertension Mother     Social History Social History   Tobacco Use  . Smoking status: Current Every Day Smoker    Packs/day: 0.50    Types: Cigarettes  . Smokeless tobacco: Never Used  Substance Use Topics  . Alcohol use: Yes    Comment: OCC  . Drug use: No     Allergies   Patient has no known allergies.   Review of Systems Review of Systems  Constitutional: Negative for appetite change.  HENT: Positive for dental problem, sinus pressure and sinus pain. Negative for sore throat and trouble swallowing.   Eyes: Negative for photophobia.  Cardiovascular: Positive for chest pain.  Gastrointestinal: Negative for abdominal pain.  Musculoskeletal: Negative for back pain.  Neurological: Negative for weakness and headaches.     Physical Exam Updated Vital Signs BP (!) 139/97 (BP Location: Left Arm)   Pulse 95   Resp 18   Ht 5\' 8"  (1.727 m)   Wt 83.9 kg   SpO2 100%   BMI 28.13 kg/m   Physical Exam Vitals signs and nursing note reviewed.  HENT:     Head:     Comments: Large cavities in upper third and fourth teeth are in the midline.  Teeth are minimally loose.  No purulent  drainage.  No gum swelling.    Nose:     Comments: Tenderness over bilateral maxillary sinuses.    Mouth/Throat:     Pharynx: No posterior oropharyngeal erythema.  Eyes:     Extraocular Movements: Extraocular movements intact.  Neck:     Musculoskeletal: Neck supple.  Cardiovascular:     Rate and Rhythm: Regular rhythm.  Skin:    General: Skin is warm.     Coloration: Skin is not jaundiced.  Neurological:     Mental Status: He is alert. Mental status is at baseline.      ED Treatments / Results  Labs (all labs ordered are listed, but only abnormal results are displayed) Labs Reviewed - No data to display  EKG None  Radiology No results found.  Procedures Procedures  (including critical care time)  Medications Ordered in ED Medications - No data to display   Initial Impression / Assessment and Plan / ED Course  I have reviewed the triage vital signs and the nursing notes.  Pertinent labs & imaging results that were available during my care of the patient were reviewed by me and considered in my medical decision making (see chart for details).        Patient with dental pain.  Also sinus pressure and reported purulent drainage.  May be combination of sinusitis and dental pain.  Will treat with amoxicillin.  Dental follow-up.  Well-appearing.  Discharge home.  Final Clinical Impressions(s) / ED Diagnoses   Final diagnoses:  Dental caries  Acute maxillary sinusitis, recurrence not specified    ED Discharge Orders         Ordered    amoxicillin (AMOXIL) 500 MG capsule  3 times daily     01/19/19 0844    HYDROcodone-acetaminophen (NORCO/VICODIN) 5-325 MG tablet  Every 6 hours PRN     01/19/19 0844           Benjiman Core, MD 01/19/19 [phone]

## 2019-01-19 NOTE — ED Triage Notes (Signed)
Pt reports dental pain on bilateral upper teeth. Pt reports both teeth are loose and have been causing him pain for 3 days.

## 2019-05-02 ENCOUNTER — Ambulatory Visit (HOSPITAL_COMMUNITY)
Admission: AD | Admit: 2019-05-02 | Discharge: 2019-05-02 | Disposition: A | Discharge status: Other Acute Inpatient Hospital | Payer: BLUE CROSS/BLUE SHIELD | Attending: Psychiatry | Admitting: Psychiatry

## 2019-05-02 DIAGNOSIS — R45851 Suicidal ideations: Secondary | ICD-10-CM | POA: Insufficient documentation

## 2019-05-02 DIAGNOSIS — F17210 Nicotine dependence, cigarettes, uncomplicated: Secondary | ICD-10-CM | POA: Diagnosis not present

## 2019-05-02 DIAGNOSIS — Z7289 Other problems related to lifestyle: Secondary | ICD-10-CM | POA: Insufficient documentation

## 2019-05-02 DIAGNOSIS — F329 Major depressive disorder, single episode, unspecified: Secondary | ICD-10-CM | POA: Insufficient documentation

## 2019-05-03 ENCOUNTER — Encounter (HOSPITAL_COMMUNITY): Payer: Self-pay

## 2019-05-03 ENCOUNTER — Inpatient Hospital Stay (HOSPITAL_COMMUNITY)
Admission: AD | Admit: 2019-05-03 | Discharge: 2019-05-04 | DRG: 881 | Disposition: A | Discharge status: Home / Self Care | Payer: BLUE CROSS/BLUE SHIELD | Attending: Psychiatry | Admitting: Psychiatry

## 2019-05-03 ENCOUNTER — Other Ambulatory Visit: Payer: Self-pay

## 2019-05-03 DIAGNOSIS — F4321 Adjustment disorder with depressed mood: Secondary | ICD-10-CM | POA: Diagnosis present

## 2019-05-03 DIAGNOSIS — F419 Anxiety disorder, unspecified: Secondary | ICD-10-CM | POA: Diagnosis present

## 2019-05-03 DIAGNOSIS — F3130 Bipolar disorder, current episode depressed, mild or moderate severity, unspecified: Secondary | ICD-10-CM | POA: Diagnosis present

## 2019-05-03 DIAGNOSIS — R45851 Suicidal ideations: Secondary | ICD-10-CM | POA: Diagnosis present

## 2019-05-03 DIAGNOSIS — F17210 Nicotine dependence, cigarettes, uncomplicated: Secondary | ICD-10-CM | POA: Diagnosis present

## 2019-05-03 DIAGNOSIS — F322 Major depressive disorder, single episode, severe without psychotic features: Secondary | ICD-10-CM | POA: Diagnosis present

## 2019-05-03 LAB — COMPREHENSIVE METABOLIC PANEL
ALT: 67 U/L — ABNORMAL HIGH (ref 0–44)
AST: 38 U/L (ref 15–41)
Albumin: 4.2 g/dL (ref 3.5–5.0)
Alkaline Phosphatase: 56 U/L (ref 38–126)
Anion gap: 8 (ref 5–15)
BUN: 12 mg/dL (ref 6–20)
CO2: 25 mmol/L (ref 22–32)
Calcium: 9.1 mg/dL (ref 8.9–10.3)
Chloride: 106 mmol/L (ref 98–111)
Creatinine, Ser: 0.7 mg/dL (ref 0.61–1.24)
GFR calc Af Amer: >60 mL/min (ref >60)
GFR calc non Af Amer: >60 mL/min (ref >60)
Glucose, Bld: 89 mg/dL (ref 70–99)
Potassium: 3.7 mmol/L (ref 3.5–5.1)
Sodium: 139 mmol/L (ref 135–145)
Total Bilirubin: 0.4 mg/dL (ref 0.3–1.2)
Total Protein: 6.9 g/dL (ref 6.5–8.1)

## 2019-05-03 LAB — CBC
HCT: 41.5 % (ref 39.0–52.0)
Hemoglobin: 14.5 g/dL (ref 13.0–17.0)
MCH: 33.8 pg (ref 26.0–34.0)
MCHC: 34.9 g/dL (ref 30.0–36.0)
MCV: 96.7 fL (ref 80.0–100.0)
Platelets: 184 10*3/uL (ref 150–400)
RBC: 4.29 MIL/uL (ref 4.22–5.81)
RDW: 12.7 % (ref 11.5–15.5)
WBC: 2.5 10*3/uL — ABNORMAL LOW (ref 4.0–10.5)
nRBC: 0 % (ref 0.0–0.2)

## 2019-05-03 LAB — RAPID URINE DRUG SCREEN, HOSP PERFORMED
Amphetamines: NOT DETECTED
Barbiturates: NOT DETECTED
Benzodiazepines: NOT DETECTED
Cocaine: NOT DETECTED
Opiates: NOT DETECTED
Tetrahydrocannabinol: NOT DETECTED

## 2019-05-03 LAB — TSH: TSH: 1.496 u[IU]/mL (ref 0.350–4.500)

## 2019-05-03 MED ORDER — TRAZODONE HCL 50 MG PO TABS
50.0000 mg | ORAL_TABLET | Freq: Every evening | ORAL | Status: DC | PRN
  Administered 2019-05-03 (×2): 50 mg via ORAL
  Filled 2019-05-03 (×2): qty 1

## 2019-05-03 MED ORDER — ALUM & MAG HYDROXIDE-SIMETH 200-200-20 MG/5ML PO SUSP: 30.0000 mL | ORAL | Status: DC | PRN

## 2019-05-03 MED ORDER — ACETAMINOPHEN 325 MG PO TABS
650.0000 mg | ORAL_TABLET | Freq: Four times a day (QID) | ORAL | Status: DC | PRN
  Administered 2019-05-03 – 2019-05-04 (×4): 650 mg via ORAL
  Filled 2019-05-03 (×4): qty 2

## 2019-05-03 MED ORDER — HYDROXYZINE HCL 25 MG PO TABS
25.0000 mg | ORAL_TABLET | Freq: Three times a day (TID) | ORAL | Status: DC | PRN
  Administered 2019-05-03 (×2): 25 mg via ORAL
  Filled 2019-05-03 (×2): qty 1

## 2019-05-03 MED ORDER — MAGNESIUM HYDROXIDE 400 MG/5ML PO SUSP: 30.0000 mL | Freq: Every day | ORAL | Status: DC | PRN

## 2019-05-03 NOTE — Progress Notes (Signed)
[name] is a 38 y.o. male involuntary admitted as a walk in for suicide ideation. Pt has been calm and cooperative with the admission process. Pt stated he does not know why he was brought in here. Pt stated he was mad because he found his wife with another man in their bed in their house. Pt states that he works two jobs and has put his wife through nursing school. Pt live with wife and 53 yr old daughter, pt stated he will not be going back to that same living when discharged from here, instead will find a place to stay. Pt stated he wanted to kill himself then and end everything, but denies that at this time. Consents signed, skin/belongings search completed and pt oriented to unit. Pt stable at this time. Pt given the opportunity to express concerns and ask questions. Pt offered food and drink, pain medication given for tooth ache and has gone bed, will continue to monitor.

## 2019-05-03 NOTE — BH Assessment (Signed)
Assessment Note  [name] is an 38 y.o. male presenting [facility] for SI with plan. PER IVC, "we responded to a call from this residence. The respondent told [place] that he wanted to kill himself because he recently discovered that his wife was cheating on him. He made a motion with an imaginary knife and motioned across his throat. He wanted "everything to end", "kill himself", and when he was going to do it "he would hang himself". The respondent [facility]."  When asked if patient was still suicidal, patient reported "I was just confused". Patient reported his best friend called and told him his wife was with someone, patient caught wife with another man in their home. 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 Patient declined depressive symptoms. Patient reported wife cheated on him 1 year ago. Patient reported no prior suicidal attempts or inpatient treatment. Patient is currently not receiving any outpatient mental health resources. Patient denied HI and psychosis. Patient admitted 2-3 drinks during the weekend. Patient resides with wife and 37 year old daughter. Patient was calm and cooperative during assessment.   Diagnosis: Major depressive symptom   Past Medical History: No past medical history on file.  Past Surgical History:  Procedure Laterality Date  . CHOLECYSTECTOMY      Family History:  Family History  Problem Relation Age of Onset  . Hypertension Mother     Social History:  reports that he has been smoking cigarettes. He has been smoking about 0.50 packs per day. He has never used smokeless tobacco. He reports current alcohol use. He reports that he does not use drugs.  Additional Social History:  Alcohol / Drug Use Pain Medications: see MAR Prescriptions: see MAR Over the Counter: see MAR  CIWA:   COWS:    Allergies: No Known Allergies  Home Medications: (Not in a hospital admission)   OB/GYN  Status:  No LMP for male patient.  General Assessment Data [facility] TTS Assessment: In system Is this a Tele or Face-to-Face Assessment?: Face-to-Face Is this an Initial Assessment or a Re-assessment for this encounter?: Initial Assessment Patient Accompanied by:: N/A Language Other than English: No Living Arrangements: (family home) What gender do you identify as?: Male Marital status: Married Pregnancy Status: Unknown Living Arrangements: Spouse/significant other, Children Can pt return to current living arrangement?: Yes Admission Status: Involuntary Petitioner: Police Is patient capable of signing voluntary admission?: (IVC) Referral Source: (police)  Medical Screening Exam [facility] Walk[facility] ONLY) Medical Exam completed: Yes  Crisis Care Plan Living Arrangements: Spouse/significant other, Children Legal Guardian: (self) Name of Psychiatrist: (none) Name of Therapist: (none)  Education Status Is patient currently in school?: No Is the patient employed, unemployed or receiving disability?: Employed  Risk to self with the past 6 months Suicidal Ideation: Yes-Currently Present Has patient been a risk to self within the past 6 months prior to admission? : Yes Suicidal Intent: Yes-Currently Present Has patient had any suicidal intent within the past 6 months prior to admission? : Yes Is patient at risk for suicide?: Yes Suicidal Plan?: No Has patient had any suicidal plan within the past 6 months prior to admission? : Yes Access to Means: (unknown) What has been your use of drugs/alcohol within the last 12 months?: (alochol) Previous Attempts/Gestures: Yes How many times?: (1) Other Self Harm Risks: (none) Triggers for Past Attempts: (n/a) Intentional Self Injurious Behavior: None Family Suicide  History: No Recent stressful life event(s): (wife infidelty) Persecutory voices/beliefs?: No Depression: No Depression Symptoms:  (denied) Substance abuse history and/or treatment for substance abuse?: No Suicide prevention information given to non-admitted patients: Not applicable  Risk to Others within the past 6 months Homicidal Ideation: No Does patient have any lifetime risk of violence toward others beyond the six months prior to admission? : No Thoughts of Harm to Others: No Current Homicidal Intent: No Current Homicidal Plan: No Access to Homicidal Means: No History of harm to others?: No Assessment of Violence: None Noted Violent Behavior Description: (none) Does patient have access to weapons?: No Criminal Charges Pending?: No Does patient have a court date: No Is patient on probation?: No  Psychosis Hallucinations: None noted Delusions: None noted  Mental Status Report Appearance/Hygiene: Unremarkable Eye Contact: Fair Motor Activity: Freedom of movement Speech: Logical/coherent Level of Consciousness: Alert Mood: Depressed Affect: Appropriate to circumstance Anxiety Level: None Thought Processes: Coherent, Relevant Judgement: Partial Orientation: Person, Place, Time, Situation Obsessive Compulsive Thoughts/Behaviors: None  Cognitive Functioning Concentration: Fair Memory: Recent Intact Is patient IDD: No Insight: Poor Impulse Control: Poor Appetite: Good Have you had any weight changes? : No Change Sleep: Decreased Total Hours of Sleep: (hard to fall asleep) Vegetative Symptoms: None  ADLScreening [facility] Assessment Services) Patient's cognitive ability adequate to safely complete daily activities?: Yes Patient able to express need for assistance with ADLs?: Yes Independently performs ADLs?: Yes (appropriate for developmental age)  Prior Inpatient Therapy Prior Inpatient Therapy: No  Prior Outpatient Therapy Prior Outpatient Therapy: No Does patient have an ACCT team?: No Does patient have Intensive In-House Services?  : No Does patient have [facility] services? : No Does  patient have P4CC services?: No  ADL Screening (condition at time of admission) Patient's cognitive ability adequate to safely complete daily activities?: Yes Patient able to express need for assistance with ADLs?: Yes Independently performs ADLs?: Yes (appropriate for developmental age)   Disposition:  Disposition Initial Assessment Completed for this Encounter: Yes  Nira Conn, NP, recommends overnight observation. Hassie Bruce, and RN informed of disposition.   On Site Evaluation by:   Reviewed with Physician:    Burnetta Sabin 05/03/2019 12:52 AM

## 2019-05-03 NOTE — H&P (Signed)
Behavioral Health Medical Screening Exam  [name] is an 38 y.o. male.  Total Time spent with patient: 30 minutes  Psychiatric Specialty Exam: Physical Exam  Constitutional: He is oriented to person, place, and time. He appears well-developed and well-nourished. No distress.  HENT:  Head: Normocephalic and atraumatic.  Right Ear: External ear normal.  Left Ear: External ear normal.  Eyes: Pupils are equal, round, and reactive to light. Conjunctivae are normal. Right eye exhibits no discharge. Left eye exhibits no discharge. No scleral icterus.  Respiratory: Effort normal. No respiratory distress.  Musculoskeletal: Normal range of motion.  Neurological: He is alert and oriented to person, place, and time.  Psychiatric: His mood appears anxious. His speech is not rapid and/or pressured, not delayed and not tangential. He is not withdrawn and not actively hallucinating. Thought content is not paranoid and not delusional. Cognition and memory are normal. He exhibits a depressed mood. He expresses no homicidal and no suicidal ideation.    Review of Systems  Constitutional: Negative for chills, diaphoresis, fever, malaise/fatigue and weight loss.  Respiratory: Negative for cough and shortness of breath.   Cardiovascular: Negative for chest pain.  Gastrointestinal: Negative for diarrhea, nausea and vomiting.  Psychiatric/Behavioral: Positive for depression and suicidal ideas. Negative for hallucinations, memory loss and substance abuse. The patient is nervous/anxious. The patient does not have insomnia.     Blood pressure (!) 135/97, pulse (!) 127, temperature 99.4 F (37.4 C), temperature source Oral, SpO2 97 %.There is no height or weight on file to calculate BMI.  General Appearance: Well Groomed  Eye Contact:  Fair  Speech:  Clear and Coherent and Normal Rate  Volume:  Normal  Mood:  Anxious and Depressed  Affect:  Congruent  Thought Process:  Coherent, Goal Directed and Descriptions  of Associations: Intact  Orientation:  Full (Time, Place, and Person)  Thought Content:  Logical and Hallucinations: None  Suicidal Thoughts:  Denies  Homicidal Thoughts:  Denies  Memory:  Immediate;   Good Recent;   Good  Judgement:  Intact  Insight:  Fair  Psychomotor Activity:  Normal  Concentration:  Concentration: Good and Attention Span: Good  Recall:  Good  Fund of Knowledge:  Good  Language:  Good  Akathisia:  Negative  Handed:  Right  AIMS (if indicated):     Assets:  Communication Skills Desire for Improvement Financial Resources/Insurance Housing Leisure Time Physical Health  ADL's:  Intact  Cognition:  WNL  Sleep:         Blood pressure (!) 135/97, pulse (!) 127, temperature 99.4 F (37.4 C), temperature source Oral, SpO2 97 %.  Recommendations:  Based on my evaluation the patient does not appear to have an emergency medical condition.  Jackelyn Poling, NP 05/03/2019, 6:15 AM

## 2019-05-03 NOTE — H&P (Signed)
BH Observation Unit Provider Admission PAA/H&P  Patient Identification: [name] MRN:  [id number] Date of Evaluation:  05/03/2019 Chief Complaint:  Manic depression disorder Principal Diagnosis: Adjustment disorder with depressed mood Diagnosis:  Active Problems:   Adjustment disorder with depressed mood  History of Present Illness:   [name] is an 38 y.o. male presenting under IVC to [facility] as a walk-in for SI with plan. PER IVC, "we responded to a call from this residence. The respondent told [place] that he wanted to kill himself because he recently discovered that his wife was cheating on him. He made a motion with an imaginary knife and motioned across his throat. He wanted "everything to end", "kill himself", and when he was going to do it "he would hang himself". The respondent is currently at [facility]."  When asked if patient was still suicidal, patient reported "I was just confused". Patient reported his best friend called and told him his wife was with someone, patient caught wife with another man in their home. 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 Patient declined depressive symptoms. Patient reported wife cheated on him 1 year ago. Patient reported no prior suicidal attempts or inpatient treatment. Patient is currently not receiving any outpatient mental health resources. Patient denied HI and psychosis. Patient admitted 2-3 drinks during the weekend. Patient resides with wife and 34 year old daughter. Patient was calm and cooperative during assessment.   On evaluation patient is alert and oriented x 4, pleasant and cooperative. Speech is clear and coherent. Mood is depressed and anxious and affect is congruent with mood. Thought process is coherent and thought content is logical. Patient reports that he arrived home from work and found his wife cheating on him. States that he did make some suicidal statments to the police. Denies suicidal thoughts at this  time and states that he was just angry when he made those statements. Denies homicidal thoughts or any thoughts of harming his wife or anyone else.  Denies substance abuse. Denies AVH. No indication that he is responding to internal stimuli.   Associated Signs/Symptoms: Depression Symptoms:  depressed mood, suicidal thoughts without plan, anxiety, (Hypo) Manic Symptoms:  Denies Anxiety Symptoms:  Denies Psychotic Symptoms:  Denies PTSD Symptoms: Negative Total Time spent with patient: 30 minutes  Past Psychiatric History: No pertinent history  Is the patient at risk to self? Yes.    Has the patient been a risk to self in the past 6 months? No.  Has the patient been a risk to self within the distant past? No.  Is the patient a risk to others? No.  Has the patient been a risk to others in the past 6 months? No.  Has the patient been a risk to others within the distant past? No.   Prior Inpatient Therapy:   Prior Outpatient Therapy:    Alcohol Screening:   Substance Abuse History in the last 12 months:  No. Consequences of Substance Abuse: NA Previous Psychotropic Medications: No  Psychological Evaluations: No  Past Medical History: No past medical history on file.  Past Surgical History:  Procedure Laterality Date  . CHOLECYSTECTOMY     Family History:  Family History  Problem Relation Age of Onset  . Hypertension Mother    Family Psychiatric History: unknown Tobacco Screening:   Social History:  Social History   Substance and Sexual Activity  Alcohol Use Yes   Comment: OCC     Social History  Substance and Sexual Activity  Drug Use No    Additional Social History:                           Allergies:  No Known Allergies Lab Results: No results found for this or any previous visit (from the past 48 hour(s)).  Blood Alcohol level:  Lab Results  Component Value Date   [facility]  12/13/2008    <5        LOWEST DETECTABLE LIMIT FOR SERUM ALCOHOL IS  5 mg/dL FOR MEDICAL PURPOSES ONLY   ETH  10/20/2008    5        LOWEST DETECTABLE LIMIT FOR SERUM ALCOHOL IS 5 mg/dL FOR MEDICAL PURPOSES ONLY    Metabolic Disorder Labs:  Lab Results  Component Value Date   HGBA1C  12/13/2008    4.9 (NOTE)   The ADA recommends the following therapeutic goal for glycemic   control related to Hgb A1C measurement:   Goal of Therapy:   < 7.0% Hgb A1C   Reference: American Diabetes Association: Clinical Practice   Recommendations 2008, Diabetes Care,  2008, 31:(Suppl 1).   MPG 94 12/13/2008   No results found for: PROLACTIN Lab Results  Component Value Date   CHOL  11/25/2008    139        ATP III CLASSIFICATION:  <200     mg/dL   Desirable  570-220  mg/dL   Borderline High  >=266    mg/dL   High   TRIG 84 91/67/5612   HDL 32 (L) 11/25/2008   CHOLHDL 4.3 11/25/2008   VLDL 17 11/25/2008   LDLCALC  11/25/2008    90        Total Cholesterol/HDL:CHD Risk Coronary Heart Disease Risk Table                     Men   Women  1/2 Average Risk   3.4   3.3   LDLCALC (H) 10/09/2008    123        Total Cholesterol/HDL:CHD Risk Coronary Heart Disease Risk Table                     Men   Women  1/2 Average Risk   3.4   3.3    Current Medications: Current Facility-Administered Medications  Medication Dose Route Frequency Provider Last Rate Last Dose  . acetaminophen (TYLENOL) tablet 650 mg  650 mg Oral Q6H PRN Jackelyn Poling, NP      . alum & mag hydroxide-simeth (MAALOX/MYLANTA) 200-200-20 MG/5ML suspension 30 mL  30 mL Oral Q4H PRN Nira Conn A, NP      . hydrOXYzine (ATARAX/VISTARIL) tablet 25 mg  25 mg Oral TID PRN Nira Conn A, NP      . magnesium hydroxide (MILK OF MAGNESIA) suspension 30 mL  30 mL Oral Daily PRN Nira Conn A, NP      . traZODone (DESYREL) tablet 50 mg  50 mg Oral QHS PRN Jackelyn Poling, NP       PTA Medications: Medications Prior to Admission  Medication Sig Dispense Refill Last Dose  . amoxicillin (AMOXIL) 500 MG  capsule Take 1 capsule (500 mg total) by mouth 3 (three) times daily. 21 capsule 0   . aspirin 325 MG tablet Take 975 mg by mouth daily as needed for mild pain or headache.   01/18/2019 at Unknown time  .  HYDROcodone-acetaminophen (NORCO/VICODIN) 5-325 MG tablet Take 1-2 tablets by mouth every 6 (six) hours as needed. 6 tablet 0   . methocarbamol (ROBAXIN) 500 MG tablet Take 1 tablet (500 mg total) by mouth every 8 (eight) hours as needed. (Patient not taking: Reported on 01/19/2019) 30 tablet 0 Not Taking at Unknown time  . naproxen (NAPROSYN) 500 MG tablet Take 1 tablet (500 mg total) by mouth 2 (two) times daily. (Patient not taking: Reported on 01/19/2019) 30 tablet 0 Not Taking at Unknown time  . potassium chloride SA (K-DUR,KLOR-CON) 20 MEQ tablet Take 1 tablet (20 mEq total) by mouth daily. (Patient not taking: Reported on 01/19/2019) 3 tablet 0 Not Taking at Unknown time    Musculoskeletal: Strength & Muscle Tone: within normal limits Gait & Station: normal   Psychiatric Specialty Exam: Physical Exam  Constitutional: He is oriented to person, place, and time. He appears well-developed and well-nourished. No distress.  HENT:  Head: Normocephalic and atraumatic.  Right Ear: External ear normal.  Left Ear: External ear normal.  Eyes: Pupils are equal, round, and reactive to light. Conjunctivae are normal. Right eye exhibits no discharge. Left eye exhibits no discharge. No scleral icterus.  Respiratory: Effort normal. No respiratory distress.  Musculoskeletal: Normal range of motion.  Neurological: He is alert and oriented to person, place, and time.  Psychiatric: His mood appears anxious. His speech is not rapid and/or pressured, not delayed and not tangential. He is not withdrawn and not actively hallucinating. Thought content is not paranoid and not delusional. Cognition and memory are normal. He exhibits a depressed mood. He expresses no homicidal and no suicidal ideation.    Review of  Systems  Constitutional: Negative for chills, diaphoresis, fever, malaise/fatigue and weight loss.  Respiratory: Negative for cough and shortness of breath.   Cardiovascular: Negative for chest pain.  Gastrointestinal: Negative for diarrhea, nausea and vomiting.  Psychiatric/Behavioral: Positive for depression and suicidal ideas. Negative for hallucinations, memory loss and substance abuse. The patient is nervous/anxious. The patient does not have insomnia.     Blood pressure (!) 135/97, pulse (!) 127, temperature 99.4 F (37.4 C), temperature source Oral, SpO2 97 %.There is no height or weight on file to calculate BMI.  General Appearance: Well Groomed  Eye Contact:  Fair  Speech:  Clear and Coherent and Normal Rate  Volume:  Normal  Mood:  Anxious and Depressed  Affect:  Congruent  Thought Process:  Coherent, Goal Directed and Descriptions of Associations: Intact  Orientation:  Full (Time, Place, and Person)  Thought Content:  Logical and Hallucinations: None  Suicidal Thoughts:  Denies  Homicidal Thoughts:  Denies  Memory:  Immediate;   Good Recent;   Good  Judgement:  Intact  Insight:  Fair  Psychomotor Activity:  Normal  Concentration:  Concentration: Good and Attention Span: Good  Recall:  Good  Fund of Knowledge:  Good  Language:  Good  Akathisia:  Negative  Handed:  Right  AIMS (if indicated):     Assets:  Communication Skills Desire for Improvement Financial Resources/Insurance Housing Leisure Time Physical Health  ADL's:  Intact  Cognition:  WNL  Sleep:         Treatment Plan Summary: Daily contact with patient to assess and evaluate symptoms and progress in treatment and Medication management  Observation Level/Precautions:  15 minute checks Laboratory:  CBC Chemistry Profile UDS Psychotherapy:  Individual Medications:   Vistaril 25 mg TID prn anxiety Trazodone 50 mg QHS prn sleep Consultations:  As  needed Discharge Concerns:   Estimated LOS:<24  hours Other:      Jackelyn Poling, NP 6/3/20201:12 AM

## 2019-05-03 NOTE — Progress Notes (Signed)
[place] NOVEL CORONAVIRUS (COVID-19) DAILY CHECK-OFF SYMPTOMS - answer yes or no to each - every day NO YES  Have you had a fever in the past 24 hours?  . Fever (Temp > 37.80C / 100F) X   Have you had any of these symptoms in the past 24 hours? . New Cough .  Sore Throat  .  Shortness of Breath .  Difficulty Breathing .  Unexplained Body Aches   X   Have you had any one of these symptoms in the past 24 hours not related to allergies?   . Runny Nose .  Nasal Congestion .  Sneezing   X   If you have had runny nose, nasal congestion, sneezing in the past 24 hours, has it worsened?  X   EXPOSURES - check yes or no X   Have you traveled outside the state in the past 14 days?  X   Have you been in contact with someone with a confirmed diagnosis of COVID-19 or PUI in the past 14 days without wearing appropriate PPE?  X   Have you been living in the same home as a person with confirmed diagnosis of COVID-19 or a PUI (household contact)?    X   Have you been diagnosed with COVID-19?    X              What to do next: Answered NO to all: Answered YES to anything:   Proceed with unit schedule Follow the [facility] Inpatient Flowsheet.   

## 2019-05-03 NOTE — Plan of Care (Signed)
[facility] Observation Crisis Plan  Reason for Crisis Plan:  Crisis Stabilization and Substance Abuse   Plan of Care:  Referral for Inpatient Hospitalization  Family Support:    None  Current Living Environment:  Living Arrangements: Spouse/significant other  Insurance:   Hospital Account    Name Acct ID Class Status Primary Coverage   Ryane, Fluellen [id number] BEHAVIORAL HEALTH OBSERVATION Open None        Guarantor Account (for Hospital Account [id number])    Name Relation to Pt Service Area Active? Acct Type   Lew Dawes Self [facility] Yes Behavioral Health   Address Phone       [address] [place], [place] [address] [phone](H) [phone])          Coverage Information (for Hospital Account [id number])    Not on file      Legal Guardian:  Legal Guardian: Other:(self)  Primary Care Provider:  Patient, No Pcp Per  Current Outpatient Providers:  [facility]  Psychiatrist:   Faylene Kurtz  Counselor/Therapist:   [place]  Compliant with Medications:  Yes  Additional Information: None   Bethann Punches 6/3/20201:55 AM

## 2019-05-03 NOTE — BH Assessment (Signed)
[facility] Assessment Progress Note  Per Juanetta Beets, DO, this pt requires psychiatric hospitalization.  Berneice Heinrich, RN, [facility] Rm [facility] in anticipation of a discharge scheduled for later today.  Pt presents under IVC initiated by law enforcement, and upheld by Dr Sharma Covert, and IVC documents have been faxed to [facility].  Pt's nurse, Selena Batten, has been notified, and agrees to call report to [phone].  Pt is to be transported via Patent examiner.   Doylene Canning, [place] Behavioral Health Coordinator [phone]

## 2019-05-03 NOTE — BHH Group Notes (Signed)
Adult Psychoeducational Group Note  Date:  05/03/2019 Time:  9:58 PM  Group Topic/Focus:  Wrap-Up Group:   The focus of this group is to help patients review their daily goal of treatment and discuss progress on daily workbooks.  Participation Level:  Active  Participation Quality:  Appropriate  Affect:  Appropriate  Cognitive:  Appropriate  Insight: Appropriate  Engagement in Group:  Engaged  Modes of Intervention:  Discussion and Education  Additional Comments:  Pt attended and participated in wrap up group this evening. Pt rated their day a 10/10. Pt completed their goal, which was to have a peaceful mindset.   Chrisandra Netters 05/03/2019, 9:58 PM

## 2019-05-03 NOTE — Tx Team (Signed)
Initial Treatment Plan 05/03/2019 7:07 PM [name] NGE:[id number]    PATIENT STRESSORS: Marital or family conflict   PATIENT STRENGTHS: Licensed conveyancer   PATIENT IDENTIFIED PROBLEMS: Patient angry because of marital problems                      DISCHARGE CRITERIA:  Improved stabilization in mood, thinking, and/or behavior  PRELIMINARY DISCHARGE PLAN: Return to previous living arrangement  PATIENT/FAMILY INVOLVEMENT: This treatment plan has been presented to and reviewed with the patient, [name], and/or family member, The patient and family have been given the opportunity to ask questions and make suggestions.  Jerrye Bushy, RN 05/03/2019, 7:07 PM

## 2019-05-03 NOTE — Progress Notes (Signed)
Patient transferred to the unit from the observation unit.  Skin assessment complete patient found to be free of all injury.  Patient reports seeing a man in his house and asking his wife about it.  He stated this is a man that his wife goes to school with and his wife's friends have been telling him that she has been inappropriate with her classmate.

## 2019-05-04 DIAGNOSIS — F4321 Adjustment disorder with depressed mood: Principal | ICD-10-CM

## 2019-05-04 MED ORDER — IBUPROFEN 400 MG PO TABS: 400.0000 mg | ORAL_TABLET | Freq: Four times a day (QID) | ORAL | Status: DC | PRN

## 2019-05-04 MED ORDER — AMOXICILLIN 500 MG PO CAPS: 500.0000 mg | ORAL_CAPSULE | Freq: Three times a day (TID) | ORAL | 0 refills | Status: AC

## 2019-05-04 MED ORDER — CHLORDIAZEPOXIDE HCL 25 MG PO CAPS: 25.0000 mg | ORAL_CAPSULE | Freq: Four times a day (QID) | ORAL | Status: DC | PRN

## 2019-05-04 MED ORDER — FOLIC ACID 1 MG PO TABS
1.0000 mg | ORAL_TABLET | Freq: Every day | ORAL | Status: DC
  Administered 2019-05-04: 1 mg via ORAL
  Filled 2019-05-04 (×3): qty 1

## 2019-05-04 MED ORDER — VITAMIN B-1 100 MG PO TABS
100.0000 mg | ORAL_TABLET | Freq: Every day | ORAL | Status: DC
  Administered 2019-05-04: 100 mg via ORAL
  Filled 2019-05-04 (×3): qty 1

## 2019-05-04 MED ORDER — AMOXICILLIN 500 MG PO CAPS
500.0000 mg | ORAL_CAPSULE | Freq: Three times a day (TID) | ORAL | Status: DC
  Administered 2019-05-04 (×2): 500 mg via ORAL
  Filled 2019-05-04 (×7): qty 1

## 2019-05-04 MED ORDER — BENZOCAINE 10 % MT GEL: Freq: Three times a day (TID) | OROMUCOSAL | Status: DC | PRN

## 2019-05-04 MED ORDER — BENZOCAINE 10 % MT GEL: Freq: Three times a day (TID) | OROMUCOSAL | 0 refills | Status: AC | PRN

## 2019-05-04 NOTE — Plan of Care (Signed)
Progress note  Pt found in bed; compliant with medication administration. Pt states his mouth is still bothering him which he rates at a 4-5/10. Pt is also anxious to leave, as he feels this is a misunderstanding. Pt would like to get back to work. Pt denies si/hi/ah/vh and verbally agrees to approach staff if these become apparent or before harming himself/others while at bhh. Pt provided support and encouragement. Pt given medication per protocol and standing orders. Q16m safety checks implemented and continued. Will continue ot monitor. Pt safe on the unit.   Pt progressing in the following metrics  Problem: Education: Goal: Knowledge of [facility] Education information/materials will improve Outcome: Progressing Goal: Emotional status will improve Outcome: Progressing Goal: Mental status will improve Outcome: Progressing Goal: Verbalization of understanding the information provided will improve Outcome: Progressing

## 2019-05-04 NOTE — Plan of Care (Signed)
Discharge note  Patient verbalizes readiness for discharge. Follow up plan explained, AVS, Transition record and SRA given. Prescriptions and teaching provided. Belongings returned and signed for. Suicide safety plan completed and signed. Patient verbalizes understanding. Patient denies SI/HI and assures this Clinical research associate he will seek assistance should that change. Patient discharged to lobby where pt was calling a cab.  Problem: Education: Goal: Knowledge of [place] [facility] Education information/materials will improve 05/04/2019 1513 by Raylene Miyamoto, RN Outcome: Adequate for Discharge 05/04/2019 1346 by Raylene Miyamoto, RN Outcome: Progressing Goal: Emotional status will improve 05/04/2019 1513 by Raylene Miyamoto, RN Outcome: Adequate for Discharge 05/04/2019 1346 by Raylene Miyamoto, RN Outcome: Progressing Goal: Mental status will improve 05/04/2019 1513 by Raylene Miyamoto, RN Outcome: Adequate for Discharge 05/04/2019 1346 by Raylene Miyamoto, RN Outcome: Progressing Goal: Verbalization of understanding the information provided will improve 05/04/2019 1513 by Raylene Miyamoto, RN Outcome: Adequate for Discharge 05/04/2019 1346 by Raylene Miyamoto, RN Outcome: Progressing   Problem: Activity: Goal: Interest or engagement in activities will improve Outcome: Adequate for Discharge Goal: Sleeping patterns will improve Outcome: Adequate for Discharge   Problem: Coping: Goal: Ability to verbalize frustrations and anger appropriately will improve Outcome: Adequate for Discharge Goal: Ability to demonstrate self-control will improve Outcome: Adequate for Discharge   Problem: Health Behavior/Discharge Planning: Goal: Identification of resources available to assist in meeting health care needs will improve Outcome: Adequate for Discharge Goal: Compliance with treatment plan for underlying cause of condition will improve Outcome: Adequate for Discharge   Problem: Physical  Regulation: Goal: Ability to maintain clinical measurements within normal limits will improve Outcome: Adequate for Discharge   Problem: Safety: Goal: Periods of time without injury will increase Outcome: Adequate for Discharge

## 2019-05-04 NOTE — BHH Counselor (Signed)
Patient was cleared for discharge within 24 hours of admission. Patient did not want to participate in the assessment, however he did share information on his admission process.   [name] states that once he discharges, he plans to take a taxi to his close friend's home. He reports he has not decided whether he will get his own place in the [place] area, or if he will move to [employer] [place], [place] to be with family.   [name] reports he does not plan to return to the home with his wife. He states that he does not want any outpatient referrals. He denies having any SI/HI/AVH.   CSW will follow for a safe discharge.    Baldo Daub, MSW, LCSWA Clinical Social Worker [facility]  Phone: [phone]

## 2019-05-04 NOTE — Discharge Summary (Signed)
Physician Discharge Summary Note  Patient:  [name] is an 38 y.o., male MRN:  [id number] DOB:  11/15/1981 Patient phone:  [phone] (home)  Patient address:   [address] [place] [address],  Total Time spent with patient: 15 minutes  Date of Admission:  05/03/2019 Date of Discharge: 05/04/19  Reason for Admission:  suicidal ideation  Principal Problem: Adjustment disorder with depressed mood Discharge Diagnoses: Principal Problem:   Adjustment disorder with depressed mood Active Problems:   MDD (major depressive disorder), single episode, severe , no psychosis (HCC)   Past Psychiatric History: Per admission H&P: Denied any previous psychiatric treatment, denied any previous psychiatric admissions, denied any previous psychiatric medications.  Past Medical History: History reviewed. No pertinent past medical history.  Past Surgical History:  Procedure Laterality Date  . CHOLECYSTECTOMY     Family History:  Family History  Problem Relation Age of Onset  . Hypertension Mother    Family Psychiatric  History: Denies Social History:  Social History   Substance and Sexual Activity  Alcohol Use Yes   Comment: OCC     Social History   Substance and Sexual Activity  Drug Use No    Social History   Socioeconomic History  . Marital status: Married    Spouse name: Not on file  . Number of children: Not on file  . Years of education: Not on file  . Highest education level: Not on file  Occupational History  . Not on file  Social Needs  . Financial resource strain: Not on file  . Food insecurity:    Worry: Not on file    Inability: Not on file  . Transportation needs:    Medical: Not on file    Non-medical: Not on file  Tobacco Use  . Smoking status: Current Every Day Smoker    Packs/day: 0.50    Types: Cigarettes  . Smokeless tobacco: Never Used  Substance and Sexual Activity  . Alcohol use: Yes    Comment: OCC  . Drug use: No  . Sexual activity: Not  on file  Lifestyle  . Physical activity:    Days per week: Not on file    Minutes per session: Not on file  . Stress: Not on file  Relationships  . Social connections:    Talks on phone: Not on file    Gets together: Not on file    Attends religious service: Not on file    Active member of club or organization: Not on file    Attends meetings of clubs or organizations: Not on file    Relationship status: Not on file  Other Topics Concern  . Not on file  Social History Narrative  . Not on file    Hospital Course:  From admission H&P: Patient is a 38 year old male with a past psychiatric history significant for probable alcohol use disorder as well as adjustment disorder versus major depression. The patient presented to the behavioral health hospital under involuntary commitment. The patient stated that he had found out recently that his wife of 15 years had been unfaithful to him. This was making him feel quite distraught. He told his wife that he wanted to kill himself, and apparently made some gesture with an imaginary knife around his throat. He had apparently stated that "everything needed to and and that I am going to "hang myself". The patient denies any previous suicide attempts. He stated that he was misunderstood in the emergency room and the evaluation.  He admitted that he was confused, and upset. He stated that he has a 37 year old daughter with his wife, and she means the world to him, and would never harm himself. There was concern for alcohol related issues, but blood alcohol was not ordered on admission. We do not have that data. We discussed the possibility of antidepressant medication, and he currently declined that. He stated that he had gone without alcohol for at least 7 to 10 days without any withdrawal symptoms over the last 3 to 4 months. He was admitted to the hospital for evaluation and stabilization.  Mr. Karie Mainlandli was admitted for depression after finding out  his wife had been unfaithful. He declined psychotropic medication. He showed no symptoms of withdrawal. He denied SI [facility]. Amoxicillin was started for dental abscess. [facility]. He has shown stable mood, affect, sleep, appetite, and interaction. He denies any SI/HI/AVH and contracts for safety. He declines referrals for follow-up. He is provided with prescriptions for medications upon discharge. He is leaving via taxi for discharge home.  Physical Findings: AIMS: Facial and Oral Movements Muscles of Facial Expression: None, normal Lips and Perioral Area: None, normal Jaw: None, normal Tongue: None, normal,Extremity Movements Upper (arms, wrists, hands, fingers): None, normal Lower (legs, knees, ankles, toes): None, normal, Trunk Movements Neck, shoulders, hips: None, normal, Overall Severity Severity of abnormal movements (highest score from questions above): None, normal Incapacitation due to abnormal movements: None, normal Patient's awareness of abnormal movements (rate only patient's report): No Awareness, Dental Status Current problems with teeth and/or dentures?: No Does patient usually wear dentures?: No  CIWA:  CIWA-Ar Total: 3 COWS:  COWS Total Score: 5  Musculoskeletal: Strength & Muscle Tone: within normal limits Gait & Station: normal Patient leans: N/A  Psychiatric Specialty Exam: Physical Exam  Nursing note and vitals reviewed. Constitutional: He is oriented to person, place, and time. He appears well-developed and well-nourished.  Cardiovascular: Normal rate.  Respiratory: Effort normal.  Neurological: He is alert and oriented to person, place, and time.    Review of Systems  Constitutional: Negative.   Respiratory: Negative for cough and shortness of breath.   Cardiovascular: Negative for chest pain.  Gastrointestinal: Negative for diarrhea, nausea and vomiting.  Neurological: Negative for headaches.   Psychiatric/Behavioral: Negative for depression, hallucinations, substance abuse and suicidal ideas. The patient is not nervous/anxious and does not have insomnia.     Blood pressure (!) 128/97, pulse 86, temperature 97.8 F (36.6 C), temperature source Oral, resp. rate 16, SpO2 97 %.There is no height or weight on file to calculate BMI.  See MD's discharge SRA        Has this patient used any form of tobacco in the last 30 days? (Cigarettes, Smokeless Tobacco, Cigars, and/or Pipes) No  Blood Alcohol level:  Lab Results  Component Value Date   [facility]  12/13/2008    <5        LOWEST DETECTABLE LIMIT FOR SERUM ALCOHOL IS 5 mg/dL FOR MEDICAL PURPOSES ONLY   ETH  10/20/2008    5        LOWEST DETECTABLE LIMIT FOR SERUM ALCOHOL IS 5 mg/dL FOR MEDICAL PURPOSES ONLY    Metabolic Disorder Labs:  Lab Results  Component Value Date   HGBA1C  12/13/2008    4.9 (NOTE)   The ADA recommends the following therapeutic goal for glycemic   control related to Hgb A1C measurement:   Goal of Therapy:   <  7.0% Hgb A1C   Reference: American Diabetes Association: Clinical Practice   Recommendations 2008, Diabetes Care,  2008, 31:(Suppl 1).   MPG 94 12/13/2008   No results found for: PROLACTIN Lab Results  Component Value Date   CHOL  11/25/2008    139        ATP III CLASSIFICATION:  <200     mg/dL   Desirable  641-583  mg/dL   Borderline High  >=094    mg/dL   High   TRIG 84 07/68/0881   HDL 32 (L) 11/25/2008   CHOLHDL 4.3 11/25/2008   VLDL 17 11/25/2008   LDLCALC  11/25/2008    90        Total Cholesterol/HDL:CHD Risk Coronary Heart Disease Risk Table                     Men   Women  1/2 Average Risk   3.4   3.3   LDLCALC (H) 10/09/2008    123        Total Cholesterol/HDL:CHD Risk Coronary Heart Disease Risk Table                     Men   Women  1/2 Average Risk   3.4   3.3    See Psychiatric Specialty Exam and Suicide Risk Assessment completed by Attending Physician prior to  discharge.  Discharge destination:  Home  Is patient on multiple antipsychotic therapies at discharge:  No   Has Patient had three or more failed trials of antipsychotic monotherapy by history:  No  Recommended Plan for Multiple Antipsychotic Therapies: NA  Discharge Instructions    Discharge instructions   Complete by:  As directed    Patient is instructed to take all prescribed medications as recommended. Report any side effects or adverse reactions to your outpatient psychiatrist. Patient is instructed to abstain from alcohol and illegal drugs while on prescription medications. In the event of worsening symptoms, patient is instructed to call the crisis hotline, 911, or go to the nearest emergency department for evaluation and treatment.     Allergies as of 05/04/2019   No Known Allergies     Medication List    TAKE these medications     Indication  amoxicillin 500 MG capsule Commonly known as:  AMOXIL Take 1 capsule (500 mg total) by mouth every 8 (eight) hours.  Indication:  Dental abscess   benzocaine 10 % mucosal gel Commonly known as:  ORAJEL Use as directed in the mouth or throat 3 (three) times daily as needed for mouth pain.  Indication:  mouth pain      Follow-up Information    PATIENT DECLINED ANY OUTPATIENT REFERRALS Follow up.   Why:  PATIENT DECLINED ANY OUTPATIENT REFERRALS Contact information: PATIENT DECLINED ANY OUTPATIENT REFERRALS          Follow-up recommendations: Activity as tolerated. Diet as recommended by primary care physician. Keep all scheduled follow-up appointments as recommended.   Comments:   Patient is instructed to take all prescribed medications as recommended. Report any side effects or adverse reactions to your outpatient psychiatrist. Patient is instructed to abstain from alcohol and illegal drugs while on prescription medications. In the event of worsening symptoms, patient is instructed to call the crisis hotline, 911, or  go to the nearest emergency department for evaluation and treatment.  Signed: Aldean Baker, NP 05/04/2019, 3:59 PM

## 2019-05-04 NOTE — H&P (Signed)
Psychiatric Admission Assessment Adult  Patient Identification: [name] MRN:  [id number] Date of Evaluation:  05/04/2019 Chief Complaint:  Manic depression disorder Principal Diagnosis: Adjustment disorder with depressed mood Diagnosis:  Principal Problem:   Adjustment disorder with depressed mood Active Problems:   MDD (major depressive disorder), single episode, severe , no psychosis (HCC)  History of Present Illness: Patient is seen and examined.  Patient is a 38 year old male with a past psychiatric history significant for probable alcohol use disorder as well as adjustment disorder versus major depression.  The patient presented to the behavioral health hospital under involuntary commitment.  The patient stated that he had found out recently that his wife of 15 years had been unfaithful to him.  This was making him feel quite distraught.  He told his wife that he wanted to kill himself, and apparently made some gesture with an imaginary knife around his throat.  He had apparently stated that "everything needed to and and that I am going to "hang myself".  The patient denies any previous suicide attempts.  He stated that he was misunderstood in the emergency room and the evaluation.  He admitted that he was confused, and upset.  He stated that he has a 434 year old daughter with his wife, and she means the world to him, and would never harm himself.  There was concern for alcohol related issues, but blood alcohol was not ordered on admission.  We do not have that data.  We discussed the possibility of antidepressant medication, and he currently declined that.  He stated that he had gone without alcohol for at least 7 to 10 days without any withdrawal symptoms over the last 3 to 4 months.  He was admitted to the hospital for evaluation and stabilization.  Associated Signs/Symptoms: Depression Symptoms:  depressed mood, anhedonia, psychomotor agitation, anxiety, loss of energy/fatigue, disturbed  sleep, (Hypo) Manic Symptoms:  Impulsivity, Irritable Mood, Anxiety Symptoms:  Excessive Worry, Psychotic Symptoms:  denied PTSD Symptoms: Negative Total Time spent with patient: 30 minutes  Past Psychiatric History: Denied any previous psychiatric treatment, denied any previous psychiatric admissions, denied any previous psychiatric medications.  Is the patient at risk to self? No.  Has the patient been a risk to self in the past 6 months? No.  Has the patient been a risk to self within the distant past? No.  Is the patient a risk to others? No.  Has the patient been a risk to others in the past 6 months? No.  Has the patient been a risk to others within the distant past? No.   Prior Inpatient Therapy:   Prior Outpatient Therapy:    Alcohol Screening: 1. How often do you have a drink containing alcohol?: 2 to 4 times a month 2. How many drinks containing alcohol do you have on a typical day when you are drinking?: 3 or 4 3. How often do you have six or more drinks on one occasion?: Less than monthly AUDIT-C Score: 4 4. How often during the last year have you found that you were not able to stop drinking once you had started?: Less than monthly 5. How often during the last year have you failed to do what was normally expected from you becasue of drinking?: Less than monthly 6. How often during the last year have you needed a first drink in the morning to get yourself going after a heavy drinking session?: Less than monthly 7. How often during the last year have you had a feeling  of guilt of remorse after drinking?: Less than monthly 8. How often during the last year have you been unable to remember what happened the night before because you had been drinking?: Less than monthly 9. Have you or someone else been injured as a result of your drinking?: Yes, but not in the last year 10. Has a relative or friend or a doctor or another health worker been concerned about your drinking or  suggested you cut down?: Yes, but not in the last year Alcohol Use Disorder Identification Test Final Score (AUDIT): 13 Substance Abuse History in the last 12 months:  Yes.   Consequences of Substance Abuse: Negative Previous Psychotropic Medications: No  Psychological Evaluations: No  Past Medical History: History reviewed. No pertinent past medical history.  Past Surgical History:  Procedure Laterality Date  . CHOLECYSTECTOMY     Family History:  Family History  Problem Relation Age of Onset  . Hypertension Mother    Family Psychiatric  History: denied Tobacco Screening:   Social History:  Social History   Substance and Sexual Activity  Alcohol Use Yes   Comment: OCC     Social History   Substance and Sexual Activity  Drug Use No    Additional Social History:      Pain Medications: see MAR Prescriptions: see MAR Over the Counter: see MAR History of alcohol / drug use?: Yes Negative Consequences of Use: Personal relationships Withdrawal Symptoms: Irritability                    Allergies:  No Known Allergies Lab Results:  Results for orders placed or performed during the hospital encounter of 05/03/19 (from the past 48 hour(s))  CBC     Status: Abnormal   Collection Time: 05/03/19  6:54 AM  Result Value Ref Range   WBC 2.5 (L) 4.0 - 10.5 K/uL   RBC 4.29 4.22 - 5.81 MIL/uL   Hemoglobin 14.5 13.0 - 17.0 g/dL   HCT 16.1 09.6 - 04.5 %   MCV 96.7 80.0 - 100.0 fL   MCH 33.8 26.0 - 34.0 pg   MCHC 34.9 30.0 - 36.0 g/dL   RDW 40.9 81.1 - 91.4 %   Platelets 184 150 - 400 K/uL   nRBC 0.0 0.0 - 0.2 %    Comment: Performed at [facility], [address], [place], [place] [address]  Comprehensive metabolic panel     Status: Abnormal   Collection Time: 05/03/19  6:54 AM  Result Value Ref Range   Sodium 139 135 - 145 mmol/L   Potassium 3.7 3.5 - 5.1 mmol/L   Chloride 106 98 - 111 mmol/L   CO2 25 22 - 32 mmol/L   Glucose, Bld 89 70 - 99  mg/dL   BUN 12 6 - 20 mg/dL   Creatinine, Ser 6.21 0.61 - 1.24 mg/dL   Calcium 9.1 8.9 - 30.8 mg/dL   Total Protein 6.9 6.5 - 8.1 g/dL   Albumin 4.2 3.5 - 5.0 g/dL   AST 38 15 - 41 U/L   ALT 67 (H) 0 - 44 U/L   Alkaline Phosphatase 56 38 - 126 U/L   Total Bilirubin 0.4 0.3 - 1.2 mg/dL   GFR calc non Af Amer >60 >60 mL/min   GFR calc Af Amer >60 >60 mL/min   Anion gap 8 5 - 15    Comment: Performed at [facility], [address], [place], [place] [address]  TSH  Status: None   Collection Time: 05/03/19  6:54 AM  Result Value Ref Range   TSH 1.496 0.350 - 4.500 uIU/mL    Comment: Performed by a 3rd Generation assay with a functional sensitivity of <=0.01 uIU/mL. Performed at [facility], [address], [place], [place] [address]   Urine rapid drug screen (hosp performed)not at [facility]     Status: None   Collection Time: 05/03/19  6:54 AM  Result Value Ref Range   Opiates NONE DETECTED NONE DETECTED   Cocaine NONE DETECTED NONE DETECTED   Benzodiazepines NONE DETECTED NONE DETECTED   Amphetamines NONE DETECTED NONE DETECTED   Tetrahydrocannabinol NONE DETECTED NONE DETECTED   Barbiturates NONE DETECTED NONE DETECTED    Comment: (NOTE) DRUG SCREEN FOR MEDICAL PURPOSES ONLY.  IF CONFIRMATION IS NEEDED FOR ANY PURPOSE, NOTIFY LAB WITHIN 5 DAYS. LOWEST DETECTABLE LIMITS FOR URINE DRUG SCREEN Drug Class                     Cutoff (ng/mL) Amphetamine and metabolites    1000 Barbiturate and metabolites    200 Benzodiazepine                 200 Tricyclics and metabolites     300 Opiates and metabolites        300 Cocaine and metabolites        300 THC                            50 Performed at [facility], [address] [place], [place] [address]     Blood Alcohol level:  Lab Results  Component Value Date   [facility]  12/13/2008    <5        LOWEST DETECTABLE LIMIT FOR SERUM ALCOHOL IS 5 mg/dL FOR MEDICAL PURPOSES  ONLY   ETH  10/20/2008    5        LOWEST DETECTABLE LIMIT FOR SERUM ALCOHOL IS 5 mg/dL FOR MEDICAL PURPOSES ONLY    Metabolic Disorder Labs:  Lab Results  Component Value Date   HGBA1C  12/13/2008    4.9 (NOTE)   The ADA recommends the following therapeutic goal for glycemic   control related to Hgb A1C measurement:   Goal of Therapy:   < 7.0% Hgb A1C   Reference: American Diabetes Association: Clinical Practice   Recommendations 2008, Diabetes Care,  2008, 31:(Suppl 1).   MPG 94 12/13/2008   No results found for: PROLACTIN Lab Results  Component Value Date   CHOL  11/25/2008    139        ATP III CLASSIFICATION:  <200     mg/dL   Desirable  415-830  mg/dL   Borderline High  >=940    mg/dL   High   TRIG 84 76/80/8811   HDL 32 (L) 11/25/2008   CHOLHDL 4.3 11/25/2008   VLDL 17 11/25/2008   LDLCALC  11/25/2008    90        Total Cholesterol/HDL:CHD Risk Coronary Heart Disease Risk Table                     Men   Women  1/2 Average Risk   3.4   3.3   LDLCALC (H) 10/09/2008    123        Total Cholesterol/HDL:CHD Risk Coronary Heart Disease Risk Table  Men   Women  1/2 Average Risk   3.4   3.3    Current Medications: Current Facility-Administered Medications  Medication Dose Route Frequency Provider Last Rate Last Dose  . acetaminophen (TYLENOL) tablet 650 mg  650 mg Oral Q6H PRN Rankin, Shuvon B, NP   650 mg at 05/04/19 0540  . alum & mag hydroxide-simeth (MAALOX/MYLANTA) 200-200-20 MG/5ML suspension 30 mL  30 mL Oral Q4H PRN Rankin, Shuvon B, NP      . amoxicillin (AMOXIL) capsule 500 mg  500 mg Oral Q8H Antonieta Pert, MD   500 mg at 05/04/19 0933  . benzocaine (ORAJEL) 10 % mucosal gel   Mouth/Throat TID PRN Aldean Baker, NP      . chlordiazePOXIDE (LIBRIUM) capsule 25 mg  25 mg Oral QID PRN Antonieta Pert, MD      . folic acid (FOLVITE) tablet 1 mg  1 mg Oral Daily Antonieta Pert, MD   1 mg at 05/04/19 1478  . hydrOXYzine  (ATARAX/VISTARIL) tablet 25 mg  25 mg Oral TID PRN Rankin, Shuvon B, NP   25 mg at 05/03/19 2110  . ibuprofen (ADVIL) tablet 400 mg  400 mg Oral Q6H PRN Antonieta Pert, MD      . magnesium hydroxide (MILK OF MAGNESIA) suspension 30 mL  30 mL Oral Daily PRN Rankin, Shuvon B, NP      . thiamine (VITAMIN B-1) tablet 100 mg  100 mg Oral Daily Antonieta Pert, MD   100 mg at 05/04/19 0933  . traZODone (DESYREL) tablet 50 mg  50 mg Oral QHS PRN Rankin, Shuvon B, NP   50 mg at 05/03/19 2110   PTA Medications: No medications prior to admission.    Musculoskeletal: Strength & Muscle Tone: within normal limits Gait & Station: normal Patient leans: N/A  Psychiatric Specialty Exam: Physical Exam  Nursing note and vitals reviewed. Constitutional: He is oriented to person, place, and time. He appears well-developed and well-nourished.  HENT:  Head: Normocephalic.  Respiratory: Effort normal.  Neurological: He is alert and oriented to person, place, and time.    ROS  Blood pressure (!) 128/97, pulse 86, temperature 97.8 F (36.6 C), temperature source Oral, resp. rate 16, SpO2 97 %.There is no height or weight on file to calculate BMI.  General Appearance: Casual  Eye Contact:  Fair  Speech:  Normal Rate  Volume:  Normal  Mood:  Anxious  Affect:  Congruent  Thought Process:  Coherent and Descriptions of Associations: Intact  Orientation:  Full (Time, Place, and Person)  Thought Content:  Logical  Suicidal Thoughts:  No  Homicidal Thoughts:  No  Memory:  Immediate;   Fair Recent;   Fair Remote;   Fair  Judgement:  Intact  Insight:  Fair  Psychomotor Activity:  Increased  Concentration:  Concentration: Fair and Attention Span: Fair  Recall:  [employer] of Knowledge:  Fair  Language:  Fair  Akathisia:  Negative  Handed:  Right  AIMS (if indicated):     Assets:  Desire for Improvement Housing Resilience  ADL's:  Intact  Cognition:  WNL  Sleep:  Number of Hours: 6.5     Treatment Plan Summary: Daily contact with patient to assess and evaluate symptoms and progress in treatment, Medication management and Plan Patient is seen and examined.  Patient is a 38 year old male with a past psychiatric history significant for probable adjustment disorder versus major depression as well as possible alcohol use  disorder.  He will be admitted to the hospital.  He will be integrated into the milieu.  He will be encouraged to attend groups.  I have suggested antidepressant/antianxiety medicine such as Prozac, Paxil or Zoloft.  He is declined that at this time.  I am going to write for Librium 25 mg p.o. 4 times daily PRN a CIWA greater than 10 secondary to my concern for alcohol-related issues.  He also complains of possible abscessed tooth bilaterally, and has an appointment with a dentist sometime in the near future after the coronavirus issues are improved.  I will start amoxicillin 500 mg p.o. every 8 hours for this, I will also write for Orajel for pain relief, as well as ibuprofen.  We have discussed the fact that we need to contact his wife for collateral information, and he stated that his plan is not to return to their home, but will go to a cousin who lives in town.  Observation Level/Precautions:  15 minute checks  Laboratory:  Chemistry Profile  Psychotherapy:    Medications:    Consultations:    Discharge Concerns:    Estimated LOS:  Other:     Physician Treatment Plan for Primary Diagnosis: Adjustment disorder with depressed mood Long Term Goal(s): Improvement in symptoms so as ready for discharge  Short Term Goals: Ability to identify changes in lifestyle to reduce recurrence of condition will improve, Ability to verbalize feelings will improve, Ability to disclose and discuss suicidal ideas, Ability to demonstrate self-control will improve, Ability to identify and develop effective coping behaviors will improve, Ability to maintain clinical measurements within  normal limits will improve and Ability to identify triggers associated with substance abuse/mental health issues will improve  Physician Treatment Plan for Secondary Diagnosis: Principal Problem:   Adjustment disorder with depressed mood Active Problems:   MDD (major depressive disorder), single episode, severe , no psychosis (HCC)  Long Term Goal(s): Improvement in symptoms so as ready for discharge  Short Term Goals: Ability to identify changes in lifestyle to reduce recurrence of condition will improve, Ability to verbalize feelings will improve, Ability to disclose and discuss suicidal ideas, Ability to demonstrate self-control will improve, Ability to identify and develop effective coping behaviors will improve, Ability to maintain clinical measurements within normal limits will improve and Ability to identify triggers associated with substance abuse/mental health issues will improve  I certify that inpatient services furnished can reasonably be expected to improve the patient's condition.    Antonieta Pert, MD 6/4/20201:45 PM

## 2019-05-04 NOTE — Progress Notes (Addendum)
  [facility] Adult Case Management Discharge Plan :  Will you be returning to the same living situation after discharge:  No. Patient reports he is discharging home with a close friend.  At discharge, do you have transportation home?: Yes,  patient reports he will call a taxi Do you have the ability to pay for your medications: Yes,  BC[employer]  Release of information consent forms completed and in the chart;  Patient's signature needed at discharge.  Patient to Follow up at: Follow-up Information    PATIENT DECLINED ANY OUTPATIENT REFERRALS Follow up.   Why:  PATIENT DECLINED ANY OUTPATIENT REFERRALS Contact information: PATIENT DECLINED ANY OUTPATIENT REFERRALS          Next level of care provider has access to [facility] Link:yes  Safety Planning and Suicide Prevention discussed: Yes,  with the patient      Has patient been referred to the Quitline?: N/A patient is not a smoker  Patient has been referred for addiction treatment: N/A  Maeola Sarah, LCSWA 05/04/2019, 2:21 PM

## 2019-05-04 NOTE — [facility] Suicide Risk Assessment (Signed)
[facility] INPATIENT:  Family/Significant Other Suicide Prevention Education  Suicide Prevention Education:   Patient Refusal for Family/Significant Other Suicide Prevention Education: The patient [name] has refused to provide written consent for family/significant other to be provided Family/Significant Other Suicide Prevention Education during admission and/or prior to discharge.  Physician notified.  SPE completed with patient, as patient refused to consent to family contact. SPI pamphlet provided to pt and pt was encouraged to share information with support network, ask questions, and talk about any concerns relating to SPE. Patient denies access to guns/firearms and verbalized understanding of information provided. Mobile Crisis information also provided to patient.    Maeola Sarah 05/04/2019, 2:22 PM

## 2019-05-04 NOTE — Progress Notes (Signed)
Patient ID: [name], male   DOB: Jun 12, 1981, 38 y.o.   MRN: [id number] D: Pt observed sitting in the day room earlier in shift I interacting with his peers and staff.  Pt denied SI/HI/AVH, reported that it is a misunderstanding that led to him being here, and stated that his wife lied that he was suicidal, and the police brought him here without talking to him first.    A: Pt complained of feeling anxious, complained of trouble sleeping and jaw pain from a tooth ache.  Pt was medicated with Atarax 25mg  for anxiety, Trazodone 50mg  for insomnia and Tylenol 650mg  for jaw pain.  Pt is being maintained on Q15 minute checks for safety.  R: Will continue to monitor on Q15  Minute checks

## 2019-05-04 NOTE — Progress Notes (Addendum)
Adult Psychoeducational Group Note  Date:  05/04/2019 Time:  9:17 AM  Group Topic/Focus:  Orientation:   The focus of this group is to educate the patient on the purpose and policies of crisis stabilization and provide a format to answer questions about their admission.  The group details unit policies and expectations of patients while admitted.  Participation Level:  Active  Participation Quality:  Appropriate  Affect:  Appropriate  Cognitive:  Alert  Insight: Appropriate  Engagement in Group:  Engaged  Modes of Intervention:  Discussion and Education  Additional Comments:   Pt participated in group. Pt completed his self inventory at this time. Pt stated the he understands the rules and expectation.    Karren Cobble 05/04/2019, 9:17 AM

## 2019-05-04 NOTE — [facility] Suicide Risk Assessment (Signed)
[facility] Discharge Suicide Risk Assessment   Principal Problem: Adjustment disorder with depressed mood Discharge Diagnoses: Principal Problem:   Adjustment disorder with depressed mood Active Problems:   MDD (major depressive disorder), single episode, severe , no psychosis (HCC)   Total Time spent with patient: 30 minutes  Musculoskeletal: Strength & Muscle Tone: within normal limits Gait & Station: normal Patient leans: N/A  Psychiatric Specialty Exam: Review of Systems  All other systems reviewed and are negative.   Blood pressure (!) 128/97, pulse 86, temperature 97.8 F (36.6 C), temperature source Oral, resp. rate 16, SpO2 97 %.There is no height or weight on file to calculate BMI.  General Appearance: Casual  Eye Contact::  Fair  Speech:  Normal Rate409  Volume:  Normal  Mood:  Anxious  Affect:  Congruent  Thought Process:  Coherent and Descriptions of Associations: Intact  Orientation:  Full (Time, Place, and Person)  Thought Content:  Logical  Suicidal Thoughts:  No  Homicidal Thoughts:  No  Memory:  Immediate;   Fair Recent;   Fair Remote;   Fair  Judgement:  Intact  Insight:  Fair  Psychomotor Activity:  Increased  Concentration:  Good  Recall:  Good  Fund of Knowledge:Fair  Language: Fair  Akathisia:  Negative  Handed:  Right  AIMS (if indicated):     Assets:  Desire for Improvement Resilience  Sleep:  Number of Hours: 6.5  Cognition: WNL  ADL's:  Intact   Mental Status Per Nursing Assessment::   On Admission:  NA  Demographic Factors:  Male and Low socioeconomic status  Loss Factors: Loss of significant relationship  Historical Factors: Impulsivity  Risk Reduction Factors:   Responsible for children under 28 years of age and Sense of responsibility to family  Continued Clinical Symptoms:  Depression:   Comorbid alcohol abuse/dependence Impulsivity Alcohol/Substance Abuse/Dependencies  Cognitive Features That Contribute To Risk:   Closed-mindedness    Suicide Risk:  Minimal: No identifiable suicidal ideation.  Patients presenting with no risk factors but with morbid ruminations; may be classified as minimal risk based on the severity of the depressive symptoms    Plan Of Care/Follow-up recommendations:  Activity:  ad lib  Antonieta Pert, MD 05/04/2019, 1:38 PM

## 2019-05-04 NOTE — [facility] Suicide Risk Assessment (Signed)
[facility]   Nursing information obtained from:  Patient Demographic factors:  Male Current Mental Status:  NA Loss Factors:  Loss of significant relationship Historical Factors:  Domestic violence Risk Reduction Factors:  Living with another person, especially a relative, Responsible for children under 37 years of age  Total Time spent with patient: 30 minutes Principal Problem: Adjustment disorder with depressed mood Diagnosis:  Principal Problem:   Adjustment disorder with depressed mood Active Problems:   MDD (major depressive disorder), single episode, severe , no psychosis (HCC)  Subjective Data: Patient is seen and examined.  Patient is a 38 year old male with a past psychiatric history significant for probable alcohol use disorder as well as adjustment disorder versus major depression.  The patient presented to the behavioral health hospital under involuntary commitment.  The patient stated that he had found out recently that his wife of 15 years had been unfaithful to him.  This was making him feel quite distraught.  He told his wife that he wanted to kill himself, and apparently made some gesture with an imaginary knife around his throat.  He had apparently stated that "everything needed to and and that I am going to "hang myself".  The patient denies any previous suicide attempts.  He stated that he was misunderstood in the emergency room and the evaluation.  He admitted that he was confused, and upset.  He stated that he has a 458 year old daughter with his wife, and she means the world to him, and would never harm himself.  There was concern for alcohol related issues, but blood alcohol was not ordered on admission.  We do not have that data.  We discussed the possibility of antidepressant medication, and he currently declined that.  He stated that he had gone without alcohol for at least 7 to 10 days without any withdrawal symptoms over the last 3 to 4 months.   He was admitted to the hospital for evaluation and stabilization.  Continued Clinical Symptoms:  Alcohol Use Disorder Identification Test Final Score (AUDIT): 13 The "Alcohol Use Disorders Identification Test", Guidelines for Use in Primary Care, Second Edition.  World Science [employer] [facility](WHO). Score between 0-7:  no or low risk or alcohol related problems. Score between 8-15:  moderate risk of alcohol related problems. Score between 16-19:  high risk of alcohol related problems. Score 20 or above:  warrants further diagnostic evaluation for alcohol dependence and treatment.   CLINICAL FACTORS:   Depression:   Anhedonia Comorbid alcohol abuse/dependence Hopelessness Impulsivity Insomnia Alcohol/Substance Abuse/Dependencies   Musculoskeletal: Strength & Muscle Tone: within normal limits Gait & Station: normal Patient leans: N/A  Psychiatric Specialty Exam: Physical Exam  Nursing note and vitals reviewed. Constitutional: He is oriented to person, place, and time. He appears well-developed and well-nourished.  HENT:  Head: Normocephalic and atraumatic.  Respiratory: Effort normal.  Neurological: He is alert and oriented to person, place, and time.    ROS  Blood pressure (!) 128/97, pulse 86, temperature 97.8 F (36.6 C), temperature source Oral, resp. rate 16, SpO2 97 %.There is no height or weight on file to calculate BMI.  General Appearance: Casual  Eye Contact:  Fair  Speech:  Normal Rate  Volume:  Increased  Mood:  Anxious  Affect:  Congruent  Thought Process:  Coherent and Descriptions of Associations: Intact  Orientation:  Full (Time, Place, and Person)  Thought Content:  Logical  Suicidal Thoughts:  No  Homicidal Thoughts:  No  Memory:  Immediate;  Fair Recent;   Fair Remote;   Fair  Judgement:  Intact  Insight:  Lacking  Psychomotor Activity:  Increased  Concentration:  Concentration: Fair and Attention Span: Fair  Recall:  [employer] of Knowledge:   Fair  Language:  Fair  Akathisia:  Negative  Handed:  Right  AIMS (if indicated):     Assets:  Desire for Improvement Resilience  ADL's:  Intact  Cognition:  WNL  Sleep:  Number of Hours: 6.5      COGNITIVE FEATURES THAT CONTRIBUTE TO RISK:  None    SUICIDE RISK:   Minimal: No identifiable suicidal ideation.  Patients presenting with no risk factors but with morbid ruminations; may be classified as minimal risk based on the severity of the depressive symptoms  PLAN OF CARE: Patient is seen and examined.  Patient is a 38 year old male with a past psychiatric history significant for probable adjustment disorder versus major depression as well as possible alcohol use disorder.  He will be admitted to the hospital.  He will be integrated into the milieu.  He will be encouraged to attend groups.  I have suggested antidepressant/antianxiety medicine such as Prozac, Paxil or Zoloft.  He is declined that at this time.  I am going to write for Librium 25 mg p.o. 4 times daily PRN a CIWA greater than 10 secondary to my concern for alcohol-related issues.  He also complains of possible abscessed tooth bilaterally, and has an appointment with a dentist sometime in the near future after the coronavirus issues are improved.  I will start amoxicillin 500 mg p.o. every 8 hours for this, I will also write for Orajel for pain relief, as well as ibuprofen.  We have discussed the fact that we need to contact his wife for collateral information, and he stated that his plan is not to return to their home, but will go to a cousin who lives in town.  I certify that inpatient services furnished can reasonably be expected to improve the patient's condition.   Antonieta Pert, MD 05/04/2019, 11:41 AM

## 2019-07-07 ENCOUNTER — Other Ambulatory Visit: Payer: Self-pay

## 2019-07-07 DIAGNOSIS — Z20822 Contact with and (suspected) exposure to covid-19: Secondary | ICD-10-CM

## 2019-07-08 LAB — NOVEL CORONAVIRUS, NAA: SARS-CoV-2, NAA: NOT DETECTED

## 2019-09-20 ENCOUNTER — Encounter (HOSPITAL_COMMUNITY): Payer: Self-pay

## 2019-09-20 ENCOUNTER — Other Ambulatory Visit: Payer: Self-pay

## 2019-09-20 ENCOUNTER — Emergency Department (HOSPITAL_COMMUNITY)
Admission: EM | Admit: 2019-09-20 | Discharge: 2019-09-20 | Disposition: A | Discharge status: Home / Self Care | Payer: Self-pay | Attending: Emergency Medicine | Admitting: Emergency Medicine

## 2019-09-20 DIAGNOSIS — X509XXA Other and unspecified overexertion or strenuous movements or postures, initial encounter: Secondary | ICD-10-CM | POA: Insufficient documentation

## 2019-09-20 DIAGNOSIS — M545 Low back pain, unspecified: Secondary | ICD-10-CM

## 2019-09-20 DIAGNOSIS — F17210 Nicotine dependence, cigarettes, uncomplicated: Secondary | ICD-10-CM | POA: Insufficient documentation

## 2019-09-20 DIAGNOSIS — Y93E1 Activity, personal bathing and showering: Secondary | ICD-10-CM | POA: Insufficient documentation

## 2019-09-20 DIAGNOSIS — R030 Elevated blood-pressure reading, without diagnosis of hypertension: Secondary | ICD-10-CM | POA: Insufficient documentation

## 2019-09-20 DIAGNOSIS — Y92002 Bathroom of unspecified non-institutional (private) residence single-family (private) house as the place of occurrence of the external cause: Secondary | ICD-10-CM | POA: Insufficient documentation

## 2019-09-20 DIAGNOSIS — Y999 Unspecified external cause status: Secondary | ICD-10-CM | POA: Insufficient documentation

## 2019-09-20 MED ORDER — KETOROLAC TROMETHAMINE 60 MG/2ML IM SOLN
30.0000 mg | Freq: Once | INTRAMUSCULAR | Status: AC
  Administered 2019-09-20: 07:00:00 30 mg via INTRAMUSCULAR
  Filled 2019-09-20: qty 2

## 2019-09-20 MED ORDER — NAPROXEN 500 MG PO TABS: 500.0000 mg | ORAL_TABLET | Freq: Two times a day (BID) | ORAL | 0 refills | Status: AC

## 2019-09-20 MED ORDER — METHOCARBAMOL 500 MG PO TABS: 500.0000 mg | ORAL_TABLET | Freq: Three times a day (TID) | ORAL | 0 refills | Status: AC | PRN

## 2019-09-20 MED ORDER — METHOCARBAMOL 500 MG PO TABS
500.0000 mg | ORAL_TABLET | Freq: Once | ORAL | Status: AC
  Administered 2019-09-20: 500 mg via ORAL
  Filled 2019-09-20: qty 1

## 2019-09-20 NOTE — ED Provider Notes (Signed)
[facility] DEPT Provider Note   CSN: [id number] Arrival date & time: 09/20/19  0415     History   Chief Complaint Chief Complaint  Patient presents with  . Back Pain    HPI [name] is a 38 y.o. male with a hx of tobacco abuse, depression, & prior cholecystectomy who presents to the ED with complaints of lower back pain x 2 days. Patient states he was in the shower when his back did not feel quite right, it seemed to seize up on him, he tried to pick something up shortly after and this only made things worse. Pain is to the bilateral lower back, constant, worse with any movement, no alleviating factors. Tried ice & goodie powder without relief. Denies numbness, tingling, weakness, saddle anesthesia, incontinence to bowel/bladder, fever, chills, IV drug use, dysuria, or hx of cancer. Patient has not had prior back surgeries. Denies direct trauma/falls.      HPI  History reviewed. No pertinent past medical history.  Patient Active Problem List   Diagnosis Date Noted  . Adjustment disorder with depressed mood 05/03/2019  . MDD (major depressive disorder), single episode, severe , no psychosis ([place]) 05/03/2019  . NEUTROPENIA UNSPECIFIED 11/20/2008  . GALLSTONE PANCREATITIS 11/14/2008  . TACHYCARDIA 11/14/2008    Past Surgical History:  Procedure Laterality Date  . CHOLECYSTECTOMY          Home Medications    Prior to Admission medications   Medication Sig Start Date End Date Taking? Authorizing Provider  amoxicillin (AMOXIL) 500 MG capsule Take 1 capsule (500 mg total) by mouth every 8 (eight) hours. 05/04/19   Connye Burkitt, NP  benzocaine (ORAJEL) 10 % mucosal gel Use as directed in the mouth or throat 3 (three) times daily as needed for mouth pain. 05/04/19   Connye Burkitt, NP    Family History Family History  Problem Relation Age of Onset  . Hypertension Mother     Social History Social History   Tobacco Use  . Smoking status:  Current Every Day Smoker    Packs/day: 0.50    Types: Cigarettes  . Smokeless tobacco: Never Used  Substance Use Topics  . Alcohol use: Yes    Comment: OCC  . Drug use: No     Allergies   Patient has no known allergies.   Review of Systems Review of Systems  Constitutional: Negative for chills, fever and unexpected weight change.  Gastrointestinal: Negative for abdominal pain, nausea and vomiting.  Genitourinary: Negative for dysuria.  Musculoskeletal: Positive for back pain.  Neurological: Negative for weakness and numbness.       Negative for saddle anesthesia or bowel/bladder incontinence.      Physical Exam Updated Vital Signs BP (!) 132/96 (BP Location: Left Arm)   Pulse 82   Temp 97.6 F (36.4 C) (Oral)   Resp 16   SpO2 100%   Physical Exam Constitutional:      General: He is not in acute distress.    Appearance: He is well-developed. He is not toxic-appearing.  HENT:     Head: Normocephalic and atraumatic.  Neck:     Musculoskeletal: Normal range of motion and neck supple. No spinous process tenderness or muscular tenderness.  Musculoskeletal:     Comments: No obvious deformity, appreciable swelling, erythema, ecchymosis, significant open wounds, or increased warmth.  Extremities: Normal ROM. Nontender.  Back: Patient diffusely tender to the lumbar region including midline & bilateral paraspinal muscles. No point/focal vertebral tenderness,  no palpable step off or crepitus.  Skin:    General: Skin is warm and dry.     Findings: No rash.  Neurological:     Mental Status: He is alert.     Deep Tendon Reflexes:     Reflex Scores:      Patellar reflexes are 2+ on the right side and 2+ on the left side.    Comments: Sensation grossly intact to bilateral lower extremities. 5/5 symmetric strength with plantar/dorsiflexion bilaterally. Gait is intact without obvious foot drop.       ED Treatments / Results  Labs (all labs ordered are listed, but only  abnormal results are displayed) Labs Reviewed - No data to display  EKG None  Radiology No results found.  Procedures Procedures (including critical care time)  Medications Ordered in ED Medications  ketorolac (TORADOL) injection 30 mg (has no administration in time range)  methocarbamol (ROBAXIN) tablet 500 mg (has no administration in time range)     Initial Impression / Assessment and Plan / ED Course  I have reviewed the triage vital signs and the nursing notes.  Pertinent labs & imaging results that were available during my care of the patient were reviewed by me and considered in my medical decision making (see chart for details).    Patient presents with complaint of back pain.  Patient is nontoxic appearing, vitals are without significant abnormality- diastolic BP mildly elevated- doubt HTN emergency. Patient has normal neurologic exam, no point/focal midline tenderness to palpation. He is ambulatory in the ED.  No back pain red flags. No urinary sxs. Most likely muscle strain versus spasm. Considered disc disease, UTI/pyelonephritis, kidney stone, aortic aneurysm/dissection, cauda equina or epidural abscess however these do not fit clinical picture at this time. Will treat with Naproxen and Robaxin, discussed with patient that they are not to drive or operate heavy machinery while taking Robaxin. I discussed treatment plan, need for PCP follow-up, and return precautions with the patient. Provided opportunity for questions, patient confirmed understanding and is in agreement with plan.     Final Clinical Impressions(s) / ED Diagnoses   Final diagnoses:  Acute bilateral low back pain without sciatica    ED Discharge Orders         Ordered    naproxen (NAPROSYN) 500 MG tablet  2 times daily     09/20/19 0702    methocarbamol (ROBAXIN) 500 MG tablet  Every 8 hours PRN     09/20/19 0702           Cherly Anderson, PA-C 09/20/19 0703    Shon Baton, MD  09/20/19 [phone]

## 2019-09-20 NOTE — ED Triage Notes (Addendum)
Patient coming from home with complaints of lower back pain that started two days ago after taking a shower. Patient reports no injuries to back. Has not been able to get relief from any over the counter medications or heat or cold application. Denies any urinary symptoms.   BP 150/100 HR 92

## 2019-09-20 NOTE — Discharge Instructions (Signed)
You were seen in the emergency department for back pain today.  °At this time we suspect that your pain is related to a muscle strain/spasm.  ° °I have prescribed you an anti-inflammatory medication and a muscle relaxer.  °- Naproxen is a nonsteroidal anti-inflammatory medication that will help with pain and swelling. Be sure to take this medication as prescribed with food, 1 pill every 12 hours,  It should be taken with food, as it can cause stomach upset, and more seriously, stomach bleeding. Do not take other nonsteroidal anti-inflammatory medications with this such as Advil, Motrin, Aleve, Mobic, Goodie Powder, or Motrin.   ° °- Robaxin is the muscle relaxer I have prescribed, this is meant to help with muscle tightness. Be aware that this medication may make you drowsy therefore the first time you take this it should be at a time you are in an environment where you can rest. Do not drive or operate heavy machinery when taking this medication. Do not drink alcohol or take other sedating medications with this medicine such as narcotics or benzodiazepines.  ° °You make take Tylenol per over the counter dosing with these medications.  ° °We have prescribed you new medication(s) today. Discuss the medications prescribed today with your pharmacist as they can have adverse effects and interactions with your other medicines including over the counter and prescribed medications. Seek medical evaluation if you start to experience new or abnormal symptoms after taking one of these medicines, seek care immediately if you start to experience difficulty breathing, feeling of your throat closing, facial swelling, or rash as these could be indications of a more serious allergic reaction ° ° °The application of heat can help soothe the pain.  Maintaining your daily activities, including walking, is encourged, as it will help you get better faster than just staying in bed. ° °Your pain should get better over the next 2 weeks.   You will need to follow up with  Your primary healthcare provider in 1-2 weeks for reassessment, if you do not have a primary care provider one is provided in your discharge instructions- you may see the [facility] or call the provided phone number. However return to the ER should you develop ne or worsening symptoms or any other concerns including but not limited to severe or worsening pain, low back pain with fever, numbness, weakness, loss of bowel or bladder control, or inability to walk or urinate, you should return to the ER immediately.    °

## 2019-09-29 ENCOUNTER — Other Ambulatory Visit: Payer: Self-pay

## 2019-09-29 DIAGNOSIS — Z20822 Contact with and (suspected) exposure to covid-19: Secondary | ICD-10-CM

## 2019-10-02 LAB — NOVEL CORONAVIRUS, NAA: SARS-CoV-2, NAA: NOT DETECTED

## 2023-04-16 ENCOUNTER — Encounter (HOSPITAL_COMMUNITY): Payer: Self-pay

## 2023-04-16 ENCOUNTER — Other Ambulatory Visit: Payer: Self-pay

## 2023-04-16 ENCOUNTER — Emergency Department (HOSPITAL_COMMUNITY): Payer: Self-pay

## 2023-04-16 ENCOUNTER — Emergency Department (HOSPITAL_COMMUNITY)
Admission: EM | Admit: 2023-04-16 | Discharge: 2023-04-16 | Disposition: A | Discharge status: Home / Self Care | Payer: Self-pay | Attending: Emergency Medicine | Admitting: Emergency Medicine

## 2023-04-16 DIAGNOSIS — S60511A Abrasion of right hand, initial encounter: Secondary | ICD-10-CM | POA: Insufficient documentation

## 2023-04-16 DIAGNOSIS — S0240EA Zygomatic fracture, right side, initial encounter for closed fracture: Secondary | ICD-10-CM | POA: Insufficient documentation

## 2023-04-16 DIAGNOSIS — S01411A Laceration without foreign body of right cheek and temporomandibular area, initial encounter: Secondary | ICD-10-CM | POA: Insufficient documentation

## 2023-04-16 DIAGNOSIS — Z23 Encounter for immunization: Secondary | ICD-10-CM | POA: Insufficient documentation

## 2023-04-16 DIAGNOSIS — S0083XA Contusion of other part of head, initial encounter: Secondary | ICD-10-CM

## 2023-04-16 DIAGNOSIS — S43102A Unspecified dislocation of left acromioclavicular joint, initial encounter: Secondary | ICD-10-CM | POA: Insufficient documentation

## 2023-04-16 MED ORDER — LIDOCAINE-EPINEPHRINE-TETRACAINE (LET) TOPICAL GEL
3.0000 mL | Freq: Once | TOPICAL | Status: AC
  Administered 2023-04-16: 3 mL via TOPICAL
  Filled 2023-04-16: qty 3

## 2023-04-16 MED ORDER — OXYCODONE-ACETAMINOPHEN 5-325 MG PO TABS
1.0000 | ORAL_TABLET | Freq: Once | ORAL | Status: AC
  Administered 2023-04-16: 1 via ORAL
  Filled 2023-04-16: qty 1

## 2023-04-16 MED ORDER — IBUPROFEN 600 MG PO TABS: 600.0000 mg | ORAL_TABLET | Freq: Four times a day (QID) | ORAL | 0 refills | Status: AC | PRN

## 2023-04-16 MED ORDER — TETANUS-DIPHTH-ACELL PERTUSSIS 5-2.5-18.5 LF-MCG/0.5 IM SUSY
0.5000 mL | PREFILLED_SYRINGE | Freq: Once | INTRAMUSCULAR | Status: AC
  Administered 2023-04-16: 0.5 mL via INTRAMUSCULAR
  Filled 2023-04-16: qty 0.5

## 2023-04-16 MED ORDER — OXYCODONE-ACETAMINOPHEN 5-325 MG PO TABS: 1.0000 | ORAL_TABLET | Freq: Four times a day (QID) | ORAL | 0 refills | Status: AC | PRN

## 2023-04-16 NOTE — ED Notes (Signed)
Cleaned patient wounds. MD Madilyn Hook notified. Patient in room speaking to [employer].
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
## 2023-04-16 NOTE — ED Notes (Signed)
Patient transported to CT 

## 2023-08-14 DIAGNOSIS — Z973 Presence of spectacles and contact lenses: Secondary | ICD-10-CM | POA: Diagnosis not present

## 2023-08-14 DIAGNOSIS — Z5986 Financial insecurity: Secondary | ICD-10-CM | POA: Diagnosis not present

## 2023-08-14 DIAGNOSIS — Z833 Family history of diabetes mellitus: Secondary | ICD-10-CM | POA: Diagnosis not present

## 2023-08-14 DIAGNOSIS — I10 Essential (primary) hypertension: Secondary | ICD-10-CM | POA: Diagnosis not present

## 2023-08-14 DIAGNOSIS — Z5948 Other specified lack of adequate food: Secondary | ICD-10-CM | POA: Diagnosis not present

## 2023-08-14 DIAGNOSIS — Z5982 Transportation insecurity: Secondary | ICD-10-CM | POA: Diagnosis not present

## 2023-08-14 DIAGNOSIS — Z720 Tobacco use: Secondary | ICD-10-CM | POA: Diagnosis not present
